# Patient Record
Sex: Male | Born: 1955 | Race: White | Hispanic: No | Marital: Married | Smoking: Never smoker
Health system: Southern US, Community
[De-identification: ages and names within clinical notes are randomized; demographics above are authoritative.]

## PROBLEM LIST (undated history)

## (undated) DIAGNOSIS — F329 Major depressive disorder, single episode, unspecified: Secondary | ICD-10-CM

## (undated) DIAGNOSIS — F909 Attention-deficit hyperactivity disorder, unspecified type: Secondary | ICD-10-CM

## (undated) DIAGNOSIS — F419 Anxiety disorder, unspecified: Secondary | ICD-10-CM

## (undated) DIAGNOSIS — L570 Actinic keratosis: Secondary | ICD-10-CM

## (undated) HISTORY — DX: Actinic keratosis: L57.0

---

## 2011-02-17 DIAGNOSIS — F902 Attention-deficit hyperactivity disorder, combined type: Secondary | ICD-10-CM | POA: Insufficient documentation

## 2016-08-13 ENCOUNTER — Emergency Department
Admission: EM | Admit: 2016-08-13 | Discharge: 2016-08-13 | Disposition: A | Discharge status: Home / Self Care | Payer: BLUE CROSS/BLUE SHIELD | Attending: Emergency Medicine | Admitting: Emergency Medicine

## 2016-08-13 ENCOUNTER — Emergency Department: Payer: BLUE CROSS/BLUE SHIELD

## 2016-08-13 ENCOUNTER — Encounter: Payer: Self-pay | Admitting: Emergency Medicine

## 2016-08-13 ENCOUNTER — Telehealth: Payer: Self-pay

## 2016-08-13 DIAGNOSIS — F909 Attention-deficit hyperactivity disorder, unspecified type: Secondary | ICD-10-CM | POA: Diagnosis not present

## 2016-08-13 DIAGNOSIS — R002 Palpitations: Secondary | ICD-10-CM | POA: Diagnosis present

## 2016-08-13 DIAGNOSIS — I480 Paroxysmal atrial fibrillation: Secondary | ICD-10-CM | POA: Diagnosis not present

## 2016-08-13 DIAGNOSIS — I4891 Unspecified atrial fibrillation: Secondary | ICD-10-CM

## 2016-08-13 HISTORY — DX: Anxiety disorder, unspecified: F41.9

## 2016-08-13 HISTORY — DX: Attention-deficit hyperactivity disorder, unspecified type: F90.9

## 2016-08-13 HISTORY — DX: Major depressive disorder, single episode, unspecified: F32.9

## 2016-08-13 LAB — CBC WITH DIFFERENTIAL/PLATELET
BASOS ABS: 0.1 10*3/uL (ref 0–0.1)
Basophils Relative: 1 %
EOS ABS: 0.2 10*3/uL (ref 0–0.7)
Eosinophils Relative: 2 %
HCT: 46 % (ref 40.0–52.0)
HEMOGLOBIN: 15.6 g/dL (ref 13.0–18.0)
LYMPHS ABS: 1.4 10*3/uL (ref 1.0–3.6)
Lymphocytes Relative: 19 %
MCH: 30.2 pg (ref 26.0–34.0)
MCHC: 34 g/dL (ref 32.0–36.0)
MCV: 88.9 fL (ref 80.0–100.0)
Monocytes Absolute: 0.9 10*3/uL (ref 0.2–1.0)
Monocytes Relative: 12 %
NEUTROS PCT: 66 %
Neutro Abs: 4.7 10*3/uL (ref 1.4–6.5)
PLATELETS: 346 10*3/uL (ref 150–440)
RBC: 5.18 MIL/uL (ref 4.40–5.90)
RDW: 14.4 % (ref 11.5–14.5)
WBC: 7.2 10*3/uL (ref 3.8–10.6)

## 2016-08-13 LAB — BASIC METABOLIC PANEL WITH GFR
Anion gap: 7 (ref 5–15)
BUN: 19 mg/dL (ref 6–20)
CO2: 26 mmol/L (ref 22–32)
Calcium: 9.3 mg/dL (ref 8.9–10.3)
Chloride: 106 mmol/L (ref 101–111)
Creatinine, Ser: 0.79 mg/dL (ref 0.61–1.24)
GFR calc Af Amer: >60 mL/min
GFR calc non Af Amer: >60 mL/min
Glucose, Bld: 127 mg/dL — ABNORMAL HIGH (ref 65–99)
Potassium: 3.7 mmol/L (ref 3.5–5.1)
Sodium: 139 mmol/L (ref 135–145)

## 2016-08-13 LAB — MAGNESIUM: MAGNESIUM: 2 mg/dL (ref 1.7–2.4)

## 2016-08-13 LAB — TROPONIN I

## 2016-08-13 LAB — TSH: TSH: 1.311 u[IU]/mL (ref 0.350–4.500)

## 2016-08-13 MED ORDER — ASPIRIN 81 MG PO CHEW
324.0000 mg | CHEWABLE_TABLET | Freq: Once | ORAL | Status: AC
  Administered 2016-08-13: 324 mg via ORAL
  Filled 2016-08-13: qty 4

## 2016-08-13 MED ORDER — METOPROLOL TARTRATE 50 MG PO TABS: 50.0000 mg | ORAL_TABLET | Freq: Two times a day (BID) | ORAL | 1 refills | Status: DC

## 2016-08-13 MED ORDER — METOPROLOL TARTRATE 50 MG PO TABS
50.0000 mg | ORAL_TABLET | Freq: Once | ORAL | Status: AC
  Administered 2016-08-13: 50 mg via ORAL
  Filled 2016-08-13: qty 1

## 2016-08-13 MED ORDER — ASPIRIN 81 MG PO TABS: 81.0000 mg | ORAL_TABLET | Freq: Every day | ORAL | 1 refills | Status: DC

## 2016-08-13 MED ORDER — DILTIAZEM HCL 60 MG PO TABS
60.0000 mg | ORAL_TABLET | Freq: Once | ORAL | Status: AC
  Administered 2016-08-13: 60 mg via ORAL
  Filled 2016-08-13: qty 1

## 2016-08-13 MED ORDER — SODIUM CHLORIDE 0.9 % IV BOLUS (SEPSIS)
1000.0000 mL | Freq: Once | INTRAVENOUS | Status: AC
  Administered 2016-08-13: 1000 mL via INTRAVENOUS

## 2016-08-13 MED ORDER — DILTIAZEM HCL 25 MG/5ML IV SOLN
30.0000 mg | Freq: Once | INTRAVENOUS | Status: AC
  Administered 2016-08-13: 30 mg via INTRAVENOUS
  Filled 2016-08-13: qty 10

## 2016-08-13 MED ORDER — DILTIAZEM HCL 25 MG/5ML IV SOLN
20.0000 mg | Freq: Once | INTRAVENOUS | Status: AC
  Administered 2016-08-13: 20 mg via INTRAVENOUS

## 2016-08-13 MED ORDER — DILTIAZEM HCL 25 MG/5ML IV SOLN
INTRAVENOUS | Status: AC
  Administered 2016-08-13: 20 mg via INTRAVENOUS
  Filled 2016-08-13: qty 5

## 2016-08-13 MED ORDER — MAGNESIUM SULFATE 2 GM/50ML IV SOLN
2.0000 g | Freq: Once | INTRAVENOUS | Status: AC
Start: 2016-08-13 — End: 2016-08-13
  Administered 2016-08-13: 2 g via INTRAVENOUS
  Filled 2016-08-13: qty 50

## 2016-08-13 NOTE — ED Notes (Signed)
Upon ambulation pt heart rate in 160's, pt denies any complaints at this time.  Pt back to bed, heart rate 107 at rest.  MD notified.

## 2016-08-13 NOTE — ED Provider Notes (Signed)
[facility] Emergency Department Provider Note  ____________________________________________  Time seen: Approximately 8:28 AM  I have reviewed the triage vital signs and the nursing notes.   HISTORY  Chief Complaint Palpitations   HPI Wesley Collins is a 61 y.o. male presents for evaluation of palpitations. Patient went to his primary care doctor today to have his ADHD medication refilled when he mentioned that he was having palpitations on and off since October. They did an EKG and found patient to be in atrial fibrillation and patient was sent to the emergency room. Patient reports that since Thanksgiving these episodes have become daily mostly in the morning when he wakes up and they resolved around lunchtime. He denies ever having chest pain, dizziness, shortness of breath with these episodes. Patient denies any personal or family history of ischemic heart disease or arrhythmias. He drinks occasionally, does not smoke, does not use drugs. Drinks 4 cups of coffee a day. No personal or family history of blood clots, no recent travel or immobilization, no leg pain or swelling, no hemoptysis or exogenous hormones. Patient denies ever seeing a cardiologist.  Past Medical History:  Diagnosis Date  . ADHD   . Anxiety   . Depression     There are no active problems to display for this patient.   History reviewed. No pertinent surgical history.  Prior to Admission medications   Medication Sig Start Date End Date Taking? Authorizing Provider  aspirin 81 MG tablet Take 1 tablet (81 mg total) by mouth daily. 08/13/16   Nita Sickle, MD  methylphenidate (METADATE CD) 40 MG CR capsule Take 40 mg by mouth every morning.    Historical Provider, MD  metoprolol (LOPRESSOR) 50 MG tablet Take 1 tablet (50 mg total) by mouth 2 (two) times daily. 08/13/16 08/13/17  Nita Sickle, MD    Allergies Strattera [atomoxetine hcl]  No family history on file.  Social  History Social History  Substance Use Topics  . Smoking status: Never Smoker  . Smokeless tobacco: Never Used  . Alcohol use Yes     Comment: occassional     Review of Systems  Constitutional: Negative for fever. Eyes: Negative for visual changes. ENT: Negative for sore throat. Neck: No neck pain  Cardiovascular: Negative for chest pain. + palpitations Respiratory: Negative for shortness of breath. Gastrointestinal: Negative for abdominal pain, vomiting or diarrhea. Genitourinary: Negative for dysuria. Musculoskeletal: Negative for back pain. Skin: Negative for rash. Neurological: Negative for headaches, weakness or numbness. Psych: No SI or HI  ____________________________________________   PHYSICAL EXAM:  VITAL SIGNS: ED Triage Vitals  Enc Vitals Group     BP --      Pulse --      Resp 08/13/16 0810 20     Temp 08/13/16 0810 98.2 F (36.8 C)     Temp Source 08/13/16 0810 Oral     SpO2 08/13/16 0810 97 %     Weight 08/13/16 0811 190 lb (86.2 kg)     Height 08/13/16 0811 5\' 11"  (1.803 m)     Head Circumference --      Peak Flow --      Pain Score --      Pain Loc --      Pain Edu? --      Excl. in GC? --     Constitutional: Alert and oriented. Well appearing and in no apparent distress. HEENT:      Head: Normocephalic and atraumatic.  Eyes: Conjunctivae are normal. Sclera is non-icteric. EOMI. PERRL      Mouth/Throat: Mucous membranes are moist.       Neck: Supple with no signs of meningismus. Cardiovascular: Irregularly irregular rhythm with a tachycardic rate. No murmurs, gallops, or rubs. 2+ symmetrical distal pulses are present in all extremities. No JVD. Respiratory: Normal respiratory effort. Lungs are clear to auscultation bilaterally. No wheezes, crackles, or rhonchi.  Gastrointestinal: Soft, non tender, and non distended with positive bowel sounds. No rebound or guarding. Genitourinary: No CVA tenderness. Musculoskeletal: Nontender with  normal range of motion in all extremities. No edema, cyanosis, or erythema of extremities. Neurologic: Normal speech and language. Face is symmetric. Moving all extremities. No gross focal neurologic deficits are appreciated. Skin: Skin is warm, dry and intact. No rash noted. Psychiatric: Mood and affect are normal. Speech and behavior are normal.  ____________________________________________   LABS (all labs ordered are listed, but only abnormal results are displayed)  Labs Reviewed  BASIC METABOLIC PANEL - Abnormal; Notable for the following:       Result Value   Glucose, Bld 127 (*)    All other components within normal limits  CBC WITH DIFFERENTIAL/PLATELET  TROPONIN I  TSH  MAGNESIUM  TROPONIN I   ____________________________________________  EKG  ED ECG REPORT I, Nita Sicklearolina Khalid Lacko, the attending physician, personally viewed and interpreted this ECG.  Atrial fibrillation, ventricular rate of 165, normal QRS and QTc intervals, normal axis, no ST elevations or depressions. ____________________________________________  RADIOLOGY  CXR: Negative  ____________________________________________   PROCEDURES  Procedure(s) performed: None Procedures Critical Care performed:  None ____________________________________________   INITIAL IMPRESSION / ASSESSMENT AND PLAN / ED COURSE  61 y.o. male presents for evaluation of palpitations after found to be in atrial fibrillation at the PCPs office. Patient is asymptomatic. Initial EKG showing rate in the 160s. Patient was given Cardizem 20 mg IV followed by 30 mg IV with good rate control. He was then given by mouth Cardizem 60 mg. We'll check CBC for evidence of anemia, electrolytes for any abnormalities, TSH for hyperthyroidism, we'll cycle cardiac enzymes. Patient has no risk factors for PE and no chest pain. Watch patient on telemetry.  Clinical Course as of Aug 14 1235  Thu Aug 13, 2016  1234 Discussed patient with Dr. Kirke CorinArida  who recommended starting patient on by mouth metoprolol as patient heart rate was within normal limits at rest however jumped up to the 160s with ambulation to the bathroom in the ED while on Cardizem only. Patient received a 50 mg of by mouth metoprolol and his heart rate is currently in the 80s. I just ambulated patient and his maximum heart rate was 100. Patient remains asymptomatic. He will be discharged home on 50 mg of metoprolol twice a day, baby aspirin, and close follow-up with Dr. Kirke CorinArida in his office. Patient comfortable with the plan.  [CV]    Clinical Course User Index [CV] Nita Sicklearolina Leeloo Silverthorne, MD    Pertinent labs & imaging results that were available during my care of the patient were reviewed by me and considered in my medical decision making (see chart for details).    ____________________________________________   FINAL CLINICAL IMPRESSION(S) / ED DIAGNOSES  Final diagnoses:  Atrial fibrillation with RVR (HCC)      NEW MEDICATIONS STARTED DURING THIS VISIT:  New Prescriptions   ASPIRIN 81 MG TABLET    Take 1 tablet (81 mg total) by mouth daily.   METOPROLOL (LOPRESSOR) 50 MG TABLET  Take 1 tablet (50 mg total) by mouth 2 (two) times daily.     Note:  This document was prepared using Dragon voice recognition software and may include unintentional dictation errors.    Nita Sickle, MD 08/13/16 1239

## 2016-08-13 NOTE — Telephone Encounter (Signed)
l mom to schedule ED f/u. (within 5 days per dr Kirke Corinarida)

## 2016-08-13 NOTE — Telephone Encounter (Signed)
-----   Message from [place] OuchMuhammad A Arida, MD sent at 08/13/2016 11:26 AM EST ----- Patient referred from ER for A-fib. Schedule with first available MD within 5 days.

## 2016-08-13 NOTE — ED Triage Notes (Signed)
Pt in via EMS from [facility]; was at PCP today to refill medication, heart rate in 140's.  Pt reports intermittent palpitations since approximately October.  Pt denies any chest pain, does repots some shortness of breath upon exertion with the episodes.  Pt A/Ox4, heart rate in 160's upon arrival. MD notified.

## 2016-08-18 ENCOUNTER — Encounter: Payer: Self-pay | Admitting: Cardiology

## 2016-08-18 ENCOUNTER — Other Ambulatory Visit
Admission: RE | Admit: 2016-08-18 | Discharge: 2016-08-18 | Disposition: A | Discharge status: Home / Self Care | Payer: BLUE CROSS/BLUE SHIELD | Source: Ambulatory Visit | Attending: Cardiology | Admitting: Cardiology

## 2016-08-18 ENCOUNTER — Ambulatory Visit (INDEPENDENT_AMBULATORY_CARE_PROVIDER_SITE_OTHER): Payer: BLUE CROSS/BLUE SHIELD | Admitting: Cardiology

## 2016-08-18 VITALS — BP 120/78 | HR 126 | Ht 71.0 in | Wt 187.8 lb

## 2016-08-18 DIAGNOSIS — Z79899 Other long term (current) drug therapy: Secondary | ICD-10-CM

## 2016-08-18 DIAGNOSIS — R000 Tachycardia, unspecified: Secondary | ICD-10-CM | POA: Diagnosis present

## 2016-08-18 DIAGNOSIS — Z01818 Encounter for other preprocedural examination: Secondary | ICD-10-CM

## 2016-08-18 DIAGNOSIS — Z01810 Encounter for preprocedural cardiovascular examination: Secondary | ICD-10-CM

## 2016-08-18 DIAGNOSIS — I4891 Unspecified atrial fibrillation: Secondary | ICD-10-CM | POA: Diagnosis not present

## 2016-08-18 LAB — HEPATIC FUNCTION PANEL
ALK PHOS: 66 U/L (ref 38–126)
ALT: 69 U/L — AB (ref 17–63)
AST: 28 U/L (ref 15–41)
Albumin: 4.4 g/dL (ref 3.5–5.0)
BILIRUBIN TOTAL: 0.6 mg/dL (ref 0.3–1.2)
Total Protein: 7.3 g/dL (ref 6.5–8.1)

## 2016-08-18 LAB — PROTIME-INR
INR: 1.12
Prothrombin Time: 14.5 seconds (ref 11.4–15.2)

## 2016-08-18 MED ORDER — APIXABAN 5 MG PO TABS: 5.0000 mg | ORAL_TABLET | Freq: Two times a day (BID) | ORAL | 3 refills | Status: AC

## 2016-08-18 NOTE — Progress Notes (Signed)
Cardiology Office Note   Date:  08/18/2016   ID:  Wesley Collins, DOB Dec 11, 1955, MRN [id number]  Referring Doctor:  Rolm Gala, MD   Cardiologist:   Almond Lint, MD   Reason for consultation:  Chief Complaint  Patient presents with  . other    follow up from [facility] ER; rapid heart beats & A-Fib. Meds reviewed by the pt. verbally.       History of Present Illness: Wesley Collins is a 61 y.o. male who presents for evaluation for Afib  Pt pesented to PCP last week for routine physical. Noted to have irregular HR, EKG showed afib. He was then sent to ER. Afib RVR. Trops negative .Advised admission but pt refused. Ff up today for evaluation.  Pt reports that he started noticing irregular heartbeat back in October 2017. He denies shortness of breath at rest. He did notice increased feeling of getting winded with exertion, SOB with exertion, moderate in intensity, lasting throughout the duration of exertion, randomly occurring, spontaneously resolving. He denies chest pain.  Reports occasional dizziness. No loss of consciousness.  Patient has been healthy all his life, and does not want to go to doctors. He therefore just waited for his regular physical. They discovered that he was in atrial fibrillation.  Otherwise, patient does not complain of fever, cough, colds, abdominal pain, GI bleed, dysphagia, odynophagia, history of radiation to the chest  ROS:  Please see the history of present illness. Aside from mentioned under HPI, all other systems are reviewed and negative.     Past Medical History:  Diagnosis Date  . ADHD   . Anxiety   . Depression     History reviewed. No pertinent surgical history.   reports that he has never smoked. He has never used smokeless tobacco. He reports that he drinks alcohol. He reports that he does not use drugs.   family history includes Alzheimer's disease in his father; Leukemia in his mother.   Outpatient Medications Prior to Visit    Medication Sig Dispense Refill  . aspirin 81 MG tablet Take 1 tablet (81 mg total) by mouth daily. 30 tablet 1  . metoprolol (LOPRESSOR) 50 MG tablet Take 1 tablet (50 mg total) by mouth 2 (two) times daily. 60 tablet 1  . methylphenidate (METADATE CD) 40 MG CR capsule Take 40 mg by mouth every morning.     No facility-administered medications prior to visit.     He has stopped taking the methylphenidate  Allergies: Strattera [atomoxetine hcl] and Atomoxetine    PHYSICAL EXAM: VS:  BP 120/78 (BP Location: Left Arm, Patient Position: Sitting, Cuff Size: Normal)   Pulse (!) 126   Ht 5\' 11"  (1.803 m)   Wt 187 lb 12 oz (85.2 kg)   BMI 26.19 kg/m  , Body mass index is 26.19 kg/m. Wt Readings from Last 3 Encounters:  08/18/16 187 lb 12 oz (85.2 kg)  08/13/16 190 lb (86.2 kg)    GENERAL:  well developed, well nourished, not in acute distress HEENT: normocephalic, pink conjunctivae, anicteric sclerae, no xanthelasma, normal dentition, oropharynx clear NECK:  no neck vein engorgement, JVP normal, no hepatojugular reflux, carotid upstroke brisk and symmetric, no bruit, no thyromegaly, no lymphadenopathy LUNGS:  good respiratory effort, clear to auscultation bilaterally CV:  PMI not displaced, no thrills, no lifts, S1 and S2 within normal limits, no palpable S3 or S4, no murmurs, no rubs, no gallops ABD:  Soft, nontender, nondistended, normoactive bowel sounds, no abdominal aortic  bruit, no hepatomegaly, no splenomegaly MS: nontender back, no kyphosis, no scoliosis, no joint deformities EXT:  2+ DP/PT pulses, no edema, no varicosities, no cyanosis, no clubbing SKIN: warm, nondiaphoretic, normal turgor, no ulcers NEUROPSYCH: alert, oriented to person, place, and time, sensory/motor grossly intact, normal mood, appropriate affect  Recent Labs: 08/13/2016: BUN 19; Creatinine, Ser 0.79; Hemoglobin 15.6; Magnesium 2.0; Platelets 346; Potassium 3.7; Sodium 139; TSH 1.311   Lipid Panel No  results found for: CHOL, TRIG, HDL, CHOLHDL, VLDL, LDLCALC, LDLDIRECT   Other studies Reviewed:  EKG:  The ekg from 08/18/2016 was personally reviewed by me and it revealed A. fib, RVR, 126 BPM, nonspecific ST-T changes.  Additional studies/ records that were reviewed personally reviewed by me today include: None available   ASSESSMENT AND PLAN:  Atrial fibrillation, persistent, rapid  Ventricular  Response Unknown onsets/duration Rate not well controlled on dose of metoprolol Lengthy and detailed discussion about the pathophysiology of atrial fibrillation and the associated stroke risks. Discussed treatment options. Patient will like to proceed with trial of restoration of sinus rhythm. In this case, we will need to do a TEE to rule out left atrial thrombus. Patient does not want to get admitted to the hospital and prefers to get this an outpatient setting. We will set him up for a TEE plus minus cardioversion with the help of anesthesia.  We have discussed the nature of the procedure TEE as well as cardoversion, the benefits of rhythm restoration, as well as the risk of each part of the procedure including esophageal perforation, bleeding, infection, stroke, MI, death, among other things. Less than 1% chance of major complications. Patient and his wife verbalized understanding of the procedure and would like to proceed. we have gone over the potential outcomes of the TEE cardioversion, including inability to proceed with cardioversion if there is presence of LA thrombus, restoration of sinus rhythm with successful cardioversion, staying in sinus and then going back into atrial fibrilation, unsuccessful cardioversion. We will transition him to eliquis 5 mg twice a day for now in anticipation of possible cardioversion. We can discontinue aspirin when he starts eliquis. Continue metoprolol for now. Patient was instructed to be nothing by mouth post midnight the night before planned procedure. He  was also instructed to hold metoprolol the evening before the date of procedure. All his questions were answered.  Abstinence from alcohol and smoking cessation recommended.  Current medicines are reviewed at length with the patient today.  The patient does not have concerns regarding medicines.  Labs/ tests ordered today include: Orders Placed This Encounter  Procedures  . EKG 12-Lead    I had a lengthy and detailed discussion with the patient regarding diagnoses, prognosis, diagnostic options, treatment options , and side effects of medications.   I counseled the patient on importance of lifestyle modification including heart healthy diet, regular physical activity .   Disposition:   FU with undersigned after TEE/CV  Thank you for this consultation. We will forwarding this consultation to referring physician.    I spent at least 80 minutes with the patient today and more than 50% of the time was spent counseling the patient and coordinating care.   Signed, Almond LintAileen Mishell Donalson, MD  08/18/2016 4:00 PM    [facility]  This note was generated in part with voice recognition software and I apologize for any typographical errors that were not detected and corrected.

## 2016-08-18 NOTE — Patient Instructions (Addendum)
Medication Instructions:  Your physician has recommended you make the following change in your medication:  1- Eliquis 5 mg (1 tablet) by mouth twice a day. 2- STOP taking aspirin.  Labwork: Your physician recommends that you return for lab work in: TODAY at the [facility].   Testing/Procedures: Your physician has requested that you have a TEE/Cardioversion. During a TEE, sound waves are used to create images of your heart. It provides your doctor with information about the size and shape of your heart and how well your heart's chambers and valves are working. In this test, a transducer is attached to the end of a flexible tube that is guided down you throat and into your esophagus (the tube leading from your mouth to your stomach) to get a more detailed image of your heart. Once the TEE has determined that a blood clot is not present, the cardioversion begins. Electrical Cardioversion uses a jolt of electricity to your heart either through paddles or wired patches attached to your chest. This is a controlled, usually prescheduled, procedure. This procedure is done at the hospital and you are not awake during the procedure. You usually go home the day of the procedure. Please see the instruction sheet given to you today for more information.  You are scheduled for a Cardioversion on __1/18/18______ with Dr. Almond Lint. Please arrive at the [facility] at _06:30  a.m. on the day of your procedure.  DIET INSTRUCTIONS:  Nothing to eat or drink after midnight except your medications with a  sip of water.         1) Labs: _Pt/INR, Liver function at [facility] TODAY.  2) Medications:  YOU MAY TAKE ALL of your remaining medications with a small amount of water.  3) Must have a responsible person to drive you home.  4) Bring a current list of your medications and current insurance cards.    If you have any questions after you get home, please call the office at [phone]  Follow-Up: Your physician recommends that you schedule a follow-up appointment in: AFTER THE TEE/CARDIOVERSON.   If you need a refill on your cardiac medications before your next appointment, please call your pharmacy.    Transesophageal Echocardiogram Transesophageal echocardiography (TEE) is a special type of test that produces images of the heart by using sound waves (echocardiogram). This type of echocardiography can obtain better images of the heart than standard echocardiography. TEE is done by passing a flexible tube down the esophagus. The heart is located in front of the esophagus. Because the heart and esophagus are close to one another, your health care provider can take very clear, detailed pictures of the heart via ultrasound waves. TEE may be done:  If your health care provider needs more information based on standard echocardiography findings.  If you had a stroke. This might have happened because a clot formed in your heart. TEE can visualize different areas of the heart and check for clots.  To check valve anatomy and function.  To check for infection on the inside of your heart (endocarditis).  To evaluate the dividing wall (septum) of the heart and presence of a hole that did not close after birth (patent foramen ovale or atrial septal defect).  To help diagnose a tear in the wall of the aorta (aortic dissection).  During cardiac valve surgery. This allows the surgeon to assess the valve repair before closing the chest.  During a variety of other cardiac  procedures to guide positioning of catheters.  Sometimes before a cardioversion, which is a shock to convert heart rhythm back to normal. LET [facility] CARE PROVIDER KNOW ABOUT:   Any allergies you have.  All medicines you are taking, including vitamins, herbs, eye drops, creams, and over-the-counter medicines.  Previous problems you or members of your family have had with the use of anesthetics.  Any  blood disorders you have.  Previous surgeries you have had.  Medical conditions you have.  Swallowing difficulties.  An esophageal obstruction. RISKS AND COMPLICATIONS  Generally, TEE is a safe procedure. However, as with any procedure, complications can occur. Possible complications include an esophageal tear (rupture). BEFORE THE PROCEDURE   Do not eat or drink for 6 hours before the procedure or as directed by your health care provider.  Arrange for someone to drive you home after the procedure. Do not drive yourself home. During the procedure, you will be given medicines that can continue to make you feel drowsy and can impair your reflexes.  An IV access tube will be started in the arm. PROCEDURE   A medicine to help you relax (sedative) will be given through the IV access tube.  A medicine may be sprayed or gargled to numb the back of the throat.  Your blood pressure, heart rate, and breathing (vital signs) will be monitored during the procedure.  The TEE probe is a long, flexible tube. The tip of the probe is placed into the back of the mouth, and you will be asked to swallow. This helps to pass the tip of the probe into the esophagus. Once the tip of the probe is in the correct area, your health care provider can take pictures of the heart.  TEE is usually not a painful procedure. You may feel the probe press against the back of the throat. The probe does not enter the trachea and does not affect your breathing. AFTER THE PROCEDURE   You will be in bed, resting, until you have fully returned to consciousness.  When you first awaken, your throat may feel slightly sore and will probably still feel numb. This will improve slowly over time.  You will not be allowed to eat or drink until it is clear that the numbness has improved.  Once you have been able to drink, urinate, and sit on the edge of the bed without feeling sick to your stomach (nausea) or dizzy, you may be  cleared to go home.  You should have a friend or family member with you for the next 24 hours after your procedure. This information is not intended to replace advice given to you by your health care provider. Make sure you discuss any questions you have with your health care provider. Document Released: 10/10/2002 Document Revised: 07/25/2013 Document Reviewed: 01/19/2013 Elsevier Interactive Patient Education  2017 [employer].  Writer cardioversion is the delivery of a jolt of electricity to restore a normal rhythm to the heart. A rhythm that is too fast or is not regular keeps the heart from pumping well. In this procedure, sticky patches or metal paddles are placed on the chest to deliver electricity to the heart from a device. This procedure may be done in an emergency if:  There is low or no blood pressure as a result of the heart rhythm.  Normal rhythm must be restored as fast as possible to protect the brain and heart from further damage.  It may save a life. This  procedure may also be done for irregular or fast heart rhythms that are not immediately life-threatening. Tell a health care provider about:  Any allergies you have.  All medicines you are taking, including vitamins, herbs, eye drops, creams, and over-the-counter medicines.  Any problems you or family members have had with anesthetic medicines.  Any blood disorders you have.  Any surgeries you have had.  Any medical conditions you have.  Whether you are pregnant or may be pregnant. What are the risks? Generally, this is a safe procedure. However, problems may occur, including:  Allergic reactions to medicines.  A blood clot that breaks free and travels to other parts of your body.  The possible return of an abnormal heart rhythm within hours or days after the procedure.  Your heart stopping (cardiac arrest). This is rare. What happens before the procedure? Medicines  Your  health care provider may have you start taking:  Blood-thinning medicines (anticoagulants) so your blood does not clot as easily.  Medicines may be given to help stabilize your heart rate and rhythm.  Ask your health care provider about changing or stopping your regular medicines. This is especially important if you are taking diabetes medicines or blood thinners. General instructions  Plan to have someone take you home from the hospital or clinic.  If you will be going home right after the procedure, plan to have someone with you for 24 hours.  Follow instructions from your health care provider about eating or drinking restrictions. What happens during the procedure?  To lower your risk of infection:  Your health care team will wash or sanitize their hands.  Your skin will be washed with soap.  An IV tube will be inserted into one of your veins.  You will be given a medicine to help you relax (sedative).  Sticky patches (electrodes) or metal paddles may be placed on your chest.  An electrical shock will be delivered. The procedure may vary among health care providers and hospitals. What happens after the procedure?  Your blood pressure, heart rate, breathing rate, and blood oxygen level will be monitored until the medicines you were given have worn off.  Do not drive for 24 hours if you were given a sedative.  Your heart rhythm will be watched to make sure it does not change. This information is not intended to replace advice given to you by your health care provider. Make sure you discuss any questions you have with your health care provider. Document Released: 07/10/2002 Document Revised: 03/18/2016 Document Reviewed: 01/24/2016 Elsevier Interactive Patient Education  2017 [employer]  Electrical Cardioversion, Care After This sheet gives you information about how to care for yourself after your procedure. Your health care provider may also give you more specific  instructions. If you have problems or questions, contact your health care provider. What can I expect after the procedure? After the procedure, it is common to have:  Some redness on the skin where the shocks were given. Follow these instructions at home:  Do not drive for 24 hours if you were given a medicine to help you relax (sedative).  Take over-the-counter and prescription medicines only as told by your health care provider.  Ask your health care provider how to check your pulse. Check it often.  Rest for 48 hours after the procedure or as told by your health care provider.  Avoid or limit your caffeine use as told by your health care provider. Contact a health care provider if:  You feel like your heart is beating too quickly or your pulse is not regular.  You have a serious muscle cramp that does not go away. Get help right away if:  You have discomfort in your chest.  You are dizzy or you feel faint.  You have trouble breathing or you are short of breath.  Your speech is slurred.  You have trouble moving an arm or leg on one side of your body.  Your fingers or toes turn cold or blue. This information is not intended to replace advice given to you by your health care provider. Make sure you discuss any questions you have with your health care provider. Document Released: 05/10/2013 Document Revised: 02/21/2016 Document Reviewed: 01/24/2016 Elsevier Interactive Patient Education  2017 [employer].

## 2016-08-19 ENCOUNTER — Encounter: Payer: Self-pay | Admitting: Cardiology

## 2016-08-21 ENCOUNTER — Ambulatory Visit (HOSPITAL_BASED_OUTPATIENT_CLINIC_OR_DEPARTMENT_OTHER)
Admission: RE | Admit: 2016-08-21 | Discharge: 2016-08-21 | Disposition: A | Discharge status: Home / Self Care | Payer: BLUE CROSS/BLUE SHIELD | Source: Ambulatory Visit | Attending: Cardiology | Admitting: Cardiology

## 2016-08-21 ENCOUNTER — Ambulatory Visit
Admission: RE | Admit: 2016-08-21 | Discharge: 2016-08-21 | Disposition: A | Discharge status: Home / Self Care | Payer: BLUE CROSS/BLUE SHIELD | Source: Ambulatory Visit | Attending: Cardiology | Admitting: Cardiology

## 2016-08-21 ENCOUNTER — Ambulatory Visit: Payer: BLUE CROSS/BLUE SHIELD | Admitting: Certified Registered Nurse Anesthetist

## 2016-08-21 ENCOUNTER — Encounter
Admission: RE | Disposition: A | Discharge status: Home / Self Care | Payer: Self-pay | Source: Ambulatory Visit | Attending: Cardiology

## 2016-08-21 DIAGNOSIS — I481 Persistent atrial fibrillation: Secondary | ICD-10-CM | POA: Insufficient documentation

## 2016-08-21 DIAGNOSIS — Z7982 Long term (current) use of aspirin: Secondary | ICD-10-CM | POA: Diagnosis not present

## 2016-08-21 DIAGNOSIS — F329 Major depressive disorder, single episode, unspecified: Secondary | ICD-10-CM | POA: Insufficient documentation

## 2016-08-21 DIAGNOSIS — Z79899 Other long term (current) drug therapy: Secondary | ICD-10-CM | POA: Insufficient documentation

## 2016-08-21 DIAGNOSIS — F419 Anxiety disorder, unspecified: Secondary | ICD-10-CM | POA: Insufficient documentation

## 2016-08-21 DIAGNOSIS — I340 Nonrheumatic mitral (valve) insufficiency: Secondary | ICD-10-CM | POA: Diagnosis not present

## 2016-08-21 DIAGNOSIS — Z888 Allergy status to other drugs, medicaments and biological substances status: Secondary | ICD-10-CM | POA: Diagnosis not present

## 2016-08-21 DIAGNOSIS — Z7901 Long term (current) use of anticoagulants: Secondary | ICD-10-CM | POA: Insufficient documentation

## 2016-08-21 DIAGNOSIS — F909 Attention-deficit hyperactivity disorder, unspecified type: Secondary | ICD-10-CM | POA: Insufficient documentation

## 2016-08-21 HISTORY — PX: ELECTROPHYSIOLOGIC STUDY: SHX172A

## 2016-08-21 HISTORY — PX: TEE WITHOUT CARDIOVERSION: SHX5443

## 2016-08-21 SURGERY — ECHOCARDIOGRAM, TRANSESOPHAGEAL
Anesthesia: General

## 2016-08-21 MED ORDER — MIDAZOLAM HCL 2 MG/2ML IJ SOLN
INTRAMUSCULAR | Status: AC
Start: 2016-08-21 — End: 2016-08-21
  Filled 2016-08-21: qty 2

## 2016-08-21 MED ORDER — BUTAMBEN-TETRACAINE-BENZOCAINE 2-2-14 % EX AERO
INHALATION_SPRAY | CUTANEOUS | Status: AC
  Filled 2016-08-21: qty 20

## 2016-08-21 MED ORDER — PROPOFOL 10 MG/ML IV BOLUS
INTRAVENOUS | Status: AC
  Filled 2016-08-21: qty 20

## 2016-08-21 MED ORDER — SODIUM CHLORIDE 0.9 % IV SOLN
INTRAVENOUS | Status: DC | PRN
  Administered 2016-08-21: 07:00:00 via INTRAVENOUS

## 2016-08-21 MED ORDER — LIDOCAINE VISCOUS 2 % MT SOLN
OROMUCOSAL | Status: AC
  Filled 2016-08-21: qty 15

## 2016-08-21 MED ORDER — SODIUM CHLORIDE FLUSH 0.9 % IV SOLN
INTRAVENOUS | Status: AC
  Filled 2016-08-21: qty 20

## 2016-08-21 MED ORDER — MIDAZOLAM HCL 2 MG/2ML IJ SOLN
INTRAMUSCULAR | Status: DC | PRN
  Administered 2016-08-21 (×2): 1 mg via INTRAVENOUS

## 2016-08-21 MED ORDER — PROPOFOL 10 MG/ML IV BOLUS
INTRAVENOUS | Status: DC | PRN
  Administered 2016-08-21: 10 mg via INTRAVENOUS
  Administered 2016-08-21 (×2): 20 mg via INTRAVENOUS
  Administered 2016-08-21: 10 mg via INTRAVENOUS
  Administered 2016-08-21: 40 mg via INTRAVENOUS
  Administered 2016-08-21 (×2): 20 mg via INTRAVENOUS

## 2016-08-21 NOTE — Procedures (Signed)
TEE/Cardioversion  Pt is here for TEE/CV for Afib, RVR.  Nature of procedure, benefits, and risks including < 1% major complications including but not limited to esophageal perforation, bleeding, infection, MI, CVA, death. Patient verbalized understanding and would like to proceed. Consents signed.  During this procedure the patient is administered a total of 2mg  IV Versed and 170mg  IV Propofol administered by CRNA, to achieve and maintain moderate conscious sedation.  The patient's heart rate, blood pressure, and oxygen saturation are monitored continuously during the procedure. TEE official report to follow: no LA/LAA thrombus, negative bubble study. 200J of biphasic energy was delivered for synchronized cardioversion. Patient's rhythm converted from Afib RVR to SR with occasional PVCs, HR 67bpm. Patient tolerated the procedure well, remained hemodynamically stable. No complications noted.  Patient will continue Eliquis 5mg  bid, decrease Metoprolol tartrate to 25mg  daily. Patient will follow up in our office in 1-2 weeks, and will be setup for outpatient transthoracic echo.

## 2016-08-21 NOTE — Anesthesia Preprocedure Evaluation (Signed)
Anesthesia Evaluation  Patient identified by MRN, date of birth, ID band Patient awake    Reviewed: Allergy & Precautions, H&P , NPO status , Patient's Chart, lab work & pertinent test results  History of Anesthesia Complications (+) Family history of anesthesia reaction and history of anesthetic complications (Father stopped breathing)  Airway Mallampati: I  TM Distance: >3 FB Neck ROM: full    Dental  (+) Poor Dentition, Chipped   Pulmonary neg pulmonary ROS, neg shortness of breath,    Pulmonary exam normal breath sounds clear to auscultation       Cardiovascular Exercise Tolerance: Good (-) angina(-) Past MI and (-) DOE + dysrhythmias Atrial Fibrillation  Rhythm:irregular Rate:Tachycardia     Neuro/Psych PSYCHIATRIC DISORDERS Anxiety Depression negative neurological ROS     GI/Hepatic negative GI ROS, Neg liver ROS, neg GERD  ,  Endo/Other  negative endocrine ROS  Renal/GU negative Renal ROS  negative genitourinary   Musculoskeletal   Abdominal   Peds  Hematology negative hematology ROS (+)   Anesthesia Other Findings Signs and symptoms suggestive of sleep apnea   Past Medical History: No date: ADHD No date: Anxiety No date: Depression  History reviewed. No pertinent surgical history.  BMI    Body Mass Index:  26.08 kg/m      Reproductive/Obstetrics negative OB ROS                           Anesthesia Physical Anesthesia Plan  ASA: III  Anesthesia Plan: General   Post-op Pain Management:    Induction:   Airway Management Planned:   Additional Equipment:   Intra-op Plan:   Post-operative Plan:   Informed Consent: I have reviewed the patients History and Physical, chart, labs and discussed the procedure including the risks, benefits and alternatives for the proposed anesthesia with the patient or authorized representative who has indicated his/her understanding and  acceptance.   Dental Advisory Given  Plan Discussed with: Anesthesiologist, CRNA and Surgeon  Anesthesia Plan Comments: (Plan for sedation for TEE with conversion to general for cardioversion.  This was explained to patient who voiced understanding.  )       Anesthesia Quick Evaluation

## 2016-08-21 NOTE — Transfer of Care (Signed)
Immediate Anesthesia Transfer of Care Note  Patient: Wesley SaltKevin Pendergrass  Procedure(s) Performed: Procedure(s): TRANSESOPHAGEAL ECHOCARDIOGRAM (TEE) (N/A) CARDIOVERSION (N/A)  Patient Location: PACU  Anesthesia Type:General  Level of Consciousness: awake and alert   Airway & Oxygen Therapy: Patient Spontanous Breathing and Patient connected to nasal cannula oxygen  Post-op Assessment: Report given to RN and Post -op Vital signs reviewed and stable  Post vital signs: Reviewed and stable  Last Vitals:  Vitals:   08/21/16 0753 08/21/16 0840  BP:  109/82  Pulse:  69  Resp: 18 (!) 30  Temp:      Last Pain:  Vitals:   08/21/16 0646  TempSrc: Oral         Complications: No apparent anesthesia complications

## 2016-08-21 NOTE — Progress Notes (Signed)
*  PRELIMINARY RESULTS* Echocardiogram Echocardiogram Transesophageal has been performed.  Cristela BlueHege, Khrystyna Schwalm 08/21/2016, 8:43 AM

## 2016-08-21 NOTE — Anesthesia Procedure Notes (Signed)
Date/Time: 08/21/2016 8:00 AM Performed by: Ginger CarneMICHELET, Wesley Collins Pre-anesthesia Checklist: Patient identified, Emergency Drugs available, Suction available, Patient being monitored and Timeout performed Patient Re-evaluated:Patient Re-evaluated prior to inductionOxygen Delivery Method: Nasal cannula

## 2016-08-21 NOTE — Anesthesia Postprocedure Evaluation (Deleted)
Anesthesia Post Note  Patient: Joline SaltKevin Mirabile  Procedure(s) Performed: Procedure(s) (LRB): TRANSESOPHAGEAL ECHOCARDIOGRAM (TEE) (N/A) CARDIOVERSION (N/A)  Patient location during evaluation: PACU Anesthesia Type: General Level of consciousness: awake, awake and alert and oriented Pain management: pain level controlled Vital Signs Assessment: post-procedure vital signs reviewed and stable Respiratory status: spontaneous breathing, nonlabored ventilation and respiratory function stable Cardiovascular status: blood pressure returned to baseline and stable Postop Assessment: no headache and no backache Anesthetic complications: no     Last Vitals:  Vitals:   08/21/16 0753 08/21/16 0840  BP:  109/82  Pulse:  69  Resp: 18 (!) 30  Temp:      Last Pain:  Vitals:   08/21/16 0646  TempSrc: Oral                 Ginger CarneStephanie Michelet

## 2016-08-21 NOTE — Anesthesia Postprocedure Evaluation (Signed)
Anesthesia Post Note  Patient: Wesley SaltKevin Collins  Procedure(s) Performed: Procedure(s) (LRB): TRANSESOPHAGEAL ECHOCARDIOGRAM (TEE) (N/A) CARDIOVERSION (N/A)  Patient location during evaluation: Cath Lab Anesthesia Type: General Level of consciousness: awake and alert Pain management: pain level controlled Vital Signs Assessment: post-procedure vital signs reviewed and stable Respiratory status: spontaneous breathing, nonlabored ventilation, respiratory function stable and patient connected to nasal cannula oxygen Cardiovascular status: blood pressure returned to baseline and stable Postop Assessment: no signs of nausea or vomiting Anesthetic complications: no     Last Vitals:  Vitals:   08/21/16 0909 08/21/16 0915  BP: 119/90 126/86  Pulse: 69 71  Resp: 16 15  Temp:      Last Pain:  Vitals:   08/21/16 0646  TempSrc: Oral                 Cleda MccreedyJoseph K Davy Faught

## 2016-08-21 NOTE — Addendum Note (Signed)
Addendum  created 08/21/16 1047 by Rosaria FerriesJoseph K Corianna Avallone, MD   Delete clinical note, Sign clinical note

## 2016-08-21 NOTE — Anesthesia Post-op Follow-up Note (Cosign Needed)
Anesthesia QCDR form completed.        

## 2016-08-21 NOTE — H&P (Signed)
Cardiology Office Note   Date:  08/21/2016   ID:  Wesley Collins, DOB 30-Nov-1955, MRN [id number]  Referring Doctor:  Rolm Gala, MD      Cardiologist:   Almond Lint, MD   Reason for consultation:      Chief Complaint  Patient presents with  . other    follow up from [facility] ER; rapid heart beats & A-Fib. Meds reviewed by the pt. verbally.    pt is here for TEE/CV   History of Present Illness: Wesley Collins is a 61 y.o. male who presents for TEE/CV  Per office note 08/18/2016: Pt pesented to PCP last week for routine physical. Noted to have irregular HR, EKG showed afib. He was then sent to ER. Afib RVR. Trops negative .Advised admission but pt refused. Pt reports that he started noticing irregular heartbeat back in October 2017. He denies shortness of breath at rest. He did notice increased feeling of getting winded with exertion, SOB with exertion, moderate in intensity, lasting throughout the duration of exertion, randomly occurring, spontaneously resolving. He denies chest pain.  Reports occasional dizziness. No loss of consciousness. Patient has been healthy all his life, and does not want to go to doctors. He therefore just waited for his regular physical. They discovered that he was in atrial fibrillation.  Currently, pt doing ok. No CP, SOB, LOC, dizziness, bleeding.  No hx of GIB, odynophagia, dysphaga, hx of radiation to chest.   ROS:  Please see the history of present illness. Aside from mentioned under HPI, all other systems are reviewed and negative.         Past Medical History:  Diagnosis Date  . ADHD   . Anxiety   . Depression     History reviewed. No pertinent surgical history.   reports that he has never smoked. He has never used smokeless tobacco. He reports that he drinks alcohol. He reports that he does not use drugs.   family history includes Alzheimer's disease in his father; Leukemia in his mother.         Outpatient  Medications Prior to Visit  Medication Sig Dispense Refill  . aspirin 81 MG tablet Take 1 tablet (81 mg total) by mouth daily. 30 tablet 1  . metoprolol (LOPRESSOR) 50 MG tablet Take 1 tablet (50 mg total) by mouth 2 (two) times daily. 60 tablet 1  . methylphenidate (METADATE CD) 40 MG CR capsule Take 40 mg by mouth every morning.     No facility-administered medications prior to visit.     He has stopped taking the methylphenidate  Allergies: Strattera [atomoxetine hcl] and Atomoxetine    PHYSICAL EXAM: VS:  BP 120/78 (BP Location: Left Arm, Patient Position: Sitting, Cuff Size: Normal)   Pulse (!) 126   Ht 5\' 11"  (1.803 m)   Wt 187 lb 12 oz (85.2 kg)   BMI 26.19 kg/m  , Body mass index is 26.19 kg/m.    Wt Readings from Last 3 Encounters:  08/18/16 187 lb 12 oz (85.2 kg)  08/13/16 190 lb (86.2 kg)    GENERAL:  well developed, well nourished, not in acute distress HEENT: normocephalic, pink conjunctivae, anicteric sclerae, no xanthelasma, normal dentition, oropharynx clear NECK:  no neck vein engorgement, JVP normal, no hepatojugular reflux, carotid upstroke brisk and symmetric, no bruit, no thyromegaly, no lymphadenopathy LUNGS:  good respiratory effort, clear to auscultation bilaterally CV:  PMI not displaced, no thrills, no lifts, S1 and S2 within normal limits, no palpable S3 or  S4, no murmurs, no rubs, no gallops ABD:  Soft, nontender, nondistended, normoactive bowel sounds, no abdominal aortic bruit, no hepatomegaly, no splenomegaly MS: nontender back, no kyphosis, no scoliosis, no joint deformities EXT:  2+ DP/PT pulses, no edema, no varicosities, no cyanosis, no clubbing SKIN: warm, nondiaphoretic, normal turgor, no ulcers NEUROPSYCH: alert, oriented to person, place, and time, sensory/motor grossly intact, normal mood, appropriate affect  Recent Labs: 08/13/2016: BUN 19; Creatinine, Ser 0.79; Hemoglobin 15.6; Magnesium 2.0; Platelets 346; Potassium 3.7; Sodium  139; TSH 1.311   Lipid Panel Labs(Brief)  No results found for: CHOL, TRIG, HDL, CHOLHDL, VLDL, LDLCALC, LDLDIRECT     Other studies Reviewed:  EKG:  The ekg from 08/18/2016 was personally reviewed by me and it revealed A. fib, RVR, 126 BPM, nonspecific ST-T changes.  Additional studies/ records that were reviewed personally reviewed by me today include: None available   ASSESSMENT AND PLAN:  Atrial fibrillation, persistent, rapid  Ventricular  Response Unknown onsets/duration Rate not well controlled on dose of metoprolol Lengthy and detailed discussion about the pathophysiology of atrial fibrillation and the associated stroke risks. Discussed treatment options. Patient will like to proceed with trial of restoration of sinus rhythm. In this case, we will need to do a TEE to rule out left atrial thrombus. Patient does not want to get admitted to the hospital and prefers to get this an outpatient setting. He is here for TEE plus minus cardioversion with the help of anesthesia.  We have discussed the nature of the procedure TEE as well as cardoversion, the benefits of rhythm restoration, as well as the risk of each part of the procedure including esophageal perforation, bleeding, infection, stroke, MI, death, among other things. Less than 1% chance of major complications. Patient and his wife verbalized understanding of the procedure and would like to proceed. we have gone over the potential outcomes of the TEE cardioversion, including inability to proceed with cardioversion if there is presence of LA thrombus, restoration of sinus rhythm with successful cardioversion, staying in sinus and then going back into atrial fibrilation, unsuccessful cardioversion. He has been taking eliquis 5 mg twice a day for now in anticipation of possible cardioversion. Discontinued aspirin. metoprolol last dose 08/20/2016 am.   Pt has been NPO since midnight.  Abstinence from alcohol and smoking  cessation recommended.               I had a lengthy and detailed discussion with the patient regarding diagnoses, prognosis, diagnostic options, treatment options , and side effects of medications.   I counseled the patient on importance of lifestyle modification including heart healthy diet, regular physical activity .   Disposition:   FU with undersigned after TEE/CV  Signed, Almond LintAileen Versie Fleener, MD  08/21/2016 [facility]      Electronically signed by Almond LintAileen Sha Burling, MD at 08/19/2016 2:18 PM      Office Visit on 08/18/2016        Detailed Report

## 2016-08-28 ENCOUNTER — Other Ambulatory Visit: Payer: Self-pay | Admitting: Family Medicine

## 2016-08-28 ENCOUNTER — Ambulatory Visit (INDEPENDENT_AMBULATORY_CARE_PROVIDER_SITE_OTHER): Payer: BLUE CROSS/BLUE SHIELD

## 2016-08-28 ENCOUNTER — Other Ambulatory Visit: Payer: Self-pay

## 2016-08-28 DIAGNOSIS — I4891 Unspecified atrial fibrillation: Secondary | ICD-10-CM

## 2016-08-28 LAB — ECHOCARDIOGRAM COMPLETE
Ao-asc: 33 cm
CHL CUP DOP CALC LVOT VTI: 19.5 cm
E decel time: 363 msec
EERAT: 5.11
FS: 38 % (ref 28–44)
IVS/LV PW RATIO, ED: 0.89
LA ID, A-P, ES: 46 mm
LA diam end sys: 46 mm
LA diam index: 2.24 cm/m2
LA vol: 97.5 mL
LAVOLA4C: 92.6 mL
LAVOLIN: 47.6 mL/m2
LV PW d: 12.4 mm — AB (ref 0.6–1.1)
LV TDI E'LATERAL: 11.6
LVEEAVG: 5.11
LVEEMED: 5.11
LVELAT: 11.6 cm/s
LVOT SV: 61 mL
LVOT area: 3.14 cm2
LVOT diameter: 20 mm
LVOTPV: 94 cm/s
Lateral S' vel: 10.3 cm/s
MV Dec: 363
MV pk E vel: 59.3 m/s
MVPKAVEL: 48.1 m/s
PV Reg vel dias: 96.4 cm/s
RV TAPSE: 25.9 mm
Reg peak vel: 287 cm/s
TDI e' medial: 7.51
TR max vel: 287 cm/s

## 2016-09-08 ENCOUNTER — Ambulatory Visit (INDEPENDENT_AMBULATORY_CARE_PROVIDER_SITE_OTHER): Payer: BLUE CROSS/BLUE SHIELD | Admitting: Cardiology

## 2016-09-08 ENCOUNTER — Telehealth: Payer: Self-pay | Admitting: *Deleted

## 2016-09-08 ENCOUNTER — Encounter: Payer: Self-pay | Admitting: Cardiology

## 2016-09-08 VITALS — BP 130/82 | HR 60 | Ht 71.0 in | Wt 188.0 lb

## 2016-09-08 DIAGNOSIS — I4891 Unspecified atrial fibrillation: Secondary | ICD-10-CM | POA: Diagnosis not present

## 2016-09-08 NOTE — Patient Instructions (Signed)
Medication Instructions:  Medication Samples have been provided to the patient.  Drug name: Eliquis       Strength: 5 mg        Qty: 2 boxes  LOT: [id number]  Exp.Date: Mar 2020  Dosing instructions: One tablet twice a day. Last dose on October 02, 2016  Your physician has requested that you regularly monitor and record your blood pressure readings at home. Please use the same machine at the same time of day to check your readings and record them to bring to your follow-up visit.  Make sure to monitor blood pressures and call if they stay higher than 140/80.  Follow-Up: Your physician recommends that you discuss with your primary care physician about possible sleep study.  Your physician recommends that you schedule a follow-up appointment in: 3 months with Dr. Alvino ChapelIngal.  It was a pleasure seeing you today here in the office. Please do not hesitate to give us a call back if you have any further questions. [phone]  [place] CellarPamela A. RN, BSN

## 2016-09-08 NOTE — Telephone Encounter (Signed)
Spoke with patient and reviewed Dr. Gearlean AlfIngal's recommendations to start on Aspirin 325 mg once daily when he finishes Eliquis. He verbalized understanding and had no further questions at this time.

## 2016-09-08 NOTE — Telephone Encounter (Signed)
-----   Message from Almond LintAileen Ingal, MD sent at 09/08/2016  2:54 PM EST ----- Please remind patient after his Eliquis course, he shd be on ASA 325mg  po qd. THanks

## 2016-09-08 NOTE — Telephone Encounter (Signed)
Left voicemail message to call back  

## 2016-09-08 NOTE — Progress Notes (Signed)
`    Cardiology Office Note   Date:  09/08/2016   ID:  Wesley Collins, DOB [birth year], MRN [id number]  Referring Doctor:  Rolm Gala, MD   Cardiologist:   Almond Lint, MD   Reason for consultation:  Chief Complaint  Patient presents with  . other    F/u cardioversion. Patient states he is going well. Meds reviewed verbally with patient.       History of Present Illness: Wesley Collins is a 61 y.o. male who presents for Follow-up after cardioversion  Per last visit 08/18/2016: Pt pesented to PCP last week for routine physical. Noted to have irregular HR, EKG showed afib. He was then sent to ER. Afib RVR. Trops negative .Advised admission but pt refused. Ff up today for evaluation. Pt reports that he started noticing irregular heartbeat back in October 2017. He denies shortness of breath at rest. He did notice increased feeling of getting winded with exertion, SOB with exertion, moderate in intensity, lasting throughout the duration of exertion, randomly occurring, spontaneously resolving. He denies chest pain.  Reports occasional dizziness. No loss of consciousness.  Since last visit, patient actually feels well. No palpitations. No shortness of breath. No caffeine intake for a while now. He struggling with wanting to have occasional beer here or there. Wife gets mad at him if he sneaks in a beer.  ROS:  Please see the history of present illness. Aside from mentioned under HPI, all other systems are reviewed and negative.    Past Medical History:  Diagnosis Date  . ADHD   . Anxiety   . Depression     Past Surgical History:  Procedure Laterality Date  . ELECTROPHYSIOLOGIC STUDY N/A 08/21/2016   Procedure: CARDIOVERSION;  Surgeon: Almond Lint, MD;  Location: [facility] ORS;  Service: Cardiovascular;  Laterality: N/A;  . TEE WITHOUT CARDIOVERSION N/A 08/21/2016   Procedure: TRANSESOPHAGEAL ECHOCARDIOGRAM (TEE);  Surgeon: Almond Lint, MD;  Location: [facility] ORS;  Service:  Cardiovascular;  Laterality: N/A;     reports that he has never smoked. He has never used smokeless tobacco. He reports that he drinks alcohol. He reports that he does not use drugs.   family history includes Alzheimer's disease in his father; Leukemia in his mother.   Outpatient Medications Prior to Visit  Medication Sig Dispense Refill  . apixaban (ELIQUIS) 5 MG TABS tablet Take 1 tablet (5 mg total) by mouth 2 (two) times daily. 180 tablet 3  . metoprolol (LOPRESSOR) 50 MG tablet Take 1 tablet (50 mg total) by mouth 2 (two) times daily. (Patient taking differently: Take 50 mg by mouth. ) 60 tablet 1  . aspirin 81 MG tablet Take 1 tablet (81 mg total) by mouth daily. (Patient not taking: Reported on 08/19/2016) 30 tablet 1   No facility-administered medications prior to visit.     He has stopped taking the methylphenidate  Allergies: Strattera [atomoxetine hcl]    PHYSICAL EXAM: VS:  BP 130/82 (BP Location: Left Arm, Patient Position: Sitting, Cuff Size: Normal)   Pulse 60   Ht 5\' 11"  (1.803 m)   Wt 188 lb (85.3 kg)   BMI 26.22 kg/m  , Body mass index is 26.22 kg/m. Wt Readings from Last 3 Encounters:  09/08/16 188 lb (85.3 kg)  08/21/16 187 lb (84.8 kg)  08/18/16 187 lb 12 oz (85.2 kg)    GENERAL:  well developed, well nourished, obese, not in acute distress HEENT: normocephalic, pink conjunctivae, anicteric sclerae, no xanthelasma, normal dentition, oropharynx clear NECK:  no neck vein engorgement, JVP normal, no hepatojugular reflux, carotid upstroke brisk and symmetric, no bruit, no thyromegaly, no lymphadenopathy LUNGS:  good respiratory effort, clear to auscultation bilaterally CV:  PMI not displaced, no thrills, no lifts, S1 and S2 within normal limits, no palpable S3 or S4, no murmurs, no rubs, no gallops ABD:  Soft, nontender, nondistended, normoactive bowel sounds, no abdominal aortic bruit, no hepatomegaly, no splenomegaly MS: nontender back, no kyphosis, no  scoliosis, no joint deformities EXT:  2+ DP/PT pulses, no edema, no varicosities, no cyanosis, no clubbing SKIN: warm, nondiaphoretic, normal turgor, no ulcers NEUROPSYCH: alert, oriented to person, place, and time, sensory/motor grossly intact, normal mood, appropriate affect    Recent Labs: 08/13/2016: BUN 19; Creatinine, Ser 0.79; Hemoglobin 15.6; Magnesium 2.0; Platelets 346; Potassium 3.7; Sodium 139; TSH 1.311 08/18/2016: ALT 69   Lipid Panel No results found for: CHOL, TRIG, HDL, CHOLHDL, VLDL, LDLCALC, LDLDIRECT   Other studies Reviewed:  EKG:  The ekg from 08/18/2016 was personally reviewed by me and it revealed A. fib, RVR, 126 BPM, nonspecific ST-T changes.  EKG from 09/08/2016 was personally reviewed by me and it revealed sinus rhythm, 56 BPM.  Additional studies/ records that were reviewed personally reviewed by me today include: None available Echo 08/28/2016: Left ventricle: The cavity size was normal. There was mild   concentric hypertrophy. Systolic function was normal. The   estimated ejection fraction was in the range of 60% to 65%. Wall   motion was normal; there were no regional wall motion   abnormalities. Left ventricular diastolic function parameters   were normal. - Mitral valve: There was mild regurgitation. - Left atrium: The atrium was mildly to moderately dilated. - Right atrium: The atrium was moderately dilated. - Tricuspid valve: There was mild regurgitation. - Pulmonary arteries: Systolic pressure was mildly increased. PA   peak pressure: 41 mm Hg (S).  TEE 08/21/2016: Left ventricle: Systolic function was likely normal. No evidence   of thrombus. - Mitral valve: There was moderate regurgitation. - Left atrium: No evidence of thrombus in the atrial cavity or   appendage. - Right atrium: No evidence of thrombus in the atrial cavity or   appendage. - Atrial septum: No defect or patent foramen ovale was identified. - Tricuspid valve: There was  moderate regurgitation directed   eccentrically. This was in the setting of elevated BP.  Impressions:  - Successful cardioversion. No cardiac source of emboli was identified   ASSESSMENT AND PLAN:  Atrial fibrillation, S/p CV 200 J 08/12/2016 Evidence of biatrial enlargement, normal LVEF. Continue liquids 5 mg twice a day until 10/02/2016. After which, he should be on aspirin 325 mg by mouth daily. CHADS2-VASc=0 Trial with holding metoprolol since blood pressure and heart rate within normal limits. Continue to monitor blood pressure and heart rate. Recommend a sleep study. Pt to discuss with PCP.   Abstinence from alcohol and smoking cessation recommended.  Current medicines are reviewed at length with the patient today.  The patient does not have concerns regarding medicines.  Labs/ tests ordered today include:  Orders Placed This Encounter  Procedures  . EKG 12-Lead    I had a lengthy and detailed discussion with the patient regarding diagnoses, prognosis, diagnostic options, treatment options , and side effects of medications.   I counseled the patient on importance of lifestyle modification including heart healthy diet, regular physical activity .   Disposition:   FU with undersigned in 3 months   Signed, Almond LintAileen Nithila Sumners, MD  09/08/2016 2:49 PM    [facility]  This note was generated in part with voice recognition software and I apologize for any typographical errors that were not detected and corrected.

## 2016-12-08 ENCOUNTER — Ambulatory Visit (INDEPENDENT_AMBULATORY_CARE_PROVIDER_SITE_OTHER): Payer: BLUE CROSS/BLUE SHIELD | Admitting: Cardiology

## 2016-12-08 ENCOUNTER — Encounter: Payer: Self-pay | Admitting: Cardiology

## 2016-12-08 VITALS — BP 152/88 | HR 68 | Ht 71.0 in | Wt 193.8 lb

## 2016-12-08 DIAGNOSIS — I4891 Unspecified atrial fibrillation: Secondary | ICD-10-CM

## 2016-12-08 MED ORDER — ASPIRIN EC 325 MG PO TBEC: 325.0000 mg | DELAYED_RELEASE_TABLET | Freq: Every day | ORAL | 0 refills | Status: DC

## 2016-12-08 NOTE — Progress Notes (Signed)
`    Cardiology Office Note   Date:  12/08/2016   ID:  Wesley Collins, DOB 01-06-1956, MRN [id number]  Referring Doctor:  Rolm GalaGrandis, Heidi, MD   Cardiologist:   Almond LintAileen Idella Lamontagne, MD   Reason for consultation:  Chief Complaint  Patient presents with  . other    3 month follow up. Patient denies chest pain and SOB. Meds reviewed verbally with patient.       History of Present Illness: Wesley SaltKevin Wenrick is a 61 y.o. male who presents for Follow-up for paroxysmal atrial fibrillation   Per last visit 08/18/2016: Pt pesented to PCP last week for routine physical. Noted to have irregular HR, EKG showed afib. He was then sent to ER. Afib RVR. Trops negative .Advised admission but pt refused. Ff up today for evaluation. Pt reports that he started noticing irregular heartbeat back in October 2017. He denies shortness of breath at rest. He did notice increased feeling of getting winded with exertion, SOB with exertion, moderate in intensity, lasting throughout the duration of exertion, randomly occurring, spontaneously resolving. He denies chest pain.  Reports occasional dizziness. No loss of consciousness.  Since last visit, patient has been doing quite well. Very rare episodes of palpitations, nothing that is long lasting. He admits to not taking the aspirin on a regular basis, sometimes he forgets.  He also had gone for sleep study and failed to follow-up for the CPAP titration study.  In terms of blood pressure, blood pressure usually is high in the medical office due to his anxiety. At home, and has been well-controlled.  ROS:  Please see the history of present illness. Aside from mentioned under HPI, all other systems are reviewed and negative.    Past Medical History:  Diagnosis Date  . ADHD   . Anxiety   . Depression     Past Surgical History:  Procedure Laterality Date  . ELECTROPHYSIOLOGIC STUDY N/A 08/21/2016   Procedure: CARDIOVERSION;  Surgeon: Almond LintAileen Zandra Lajeunesse, MD;  Location: [facility] ORS;   Service: Cardiovascular;  Laterality: N/A;  . TEE WITHOUT CARDIOVERSION N/A 08/21/2016   Procedure: TRANSESOPHAGEAL ECHOCARDIOGRAM (TEE);  Surgeon: Almond LintAileen Esaiah Wanless, MD;  Location: [facility] ORS;  Service: Cardiovascular;  Laterality: N/A;     reports that he has never smoked. He has never used smokeless tobacco. He reports that he drinks alcohol. He reports that he does not use drugs.   family history includes Alzheimer's disease in his father; Leukemia in his mother.   Outpatient Medications Prior to Visit  Medication Sig Dispense Refill  . apixaban (ELIQUIS) 5 MG TABS tablet Take 1 tablet (5 mg total) by mouth 2 (two) times daily. (Patient not taking: Reported on 12/08/2016) 180 tablet 3  . metoprolol (LOPRESSOR) 50 MG tablet Take 1 tablet (50 mg total) by mouth 2 (two) times daily. (Patient not taking: Reported on 12/08/2016) 60 tablet 1   No facility-administered medications prior to visit.     He has stopped taking the methylphenidate  Allergies: Strattera [atomoxetine hcl]    PHYSICAL EXAM: VS:  BP (!) 152/88 (BP Location: Left Arm, Patient Position: Sitting, Cuff Size: Normal)   Pulse 68   Ht 5\' 11"  (1.803 m)   Wt 193 lb 12 oz (87.9 kg)   BMI 27.02 kg/m  , Body mass index is 27.02 kg/m. Wt Readings from Last 3 Encounters:  12/08/16 193 lb 12 oz (87.9 kg)  09/08/16 188 lb (85.3 kg)  08/21/16 187 lb (84.8 kg)    GENERAL:  well developed,  well nourished, not in acute distress HEENT: normocephalic, pink conjunctivae, anicteric sclerae, no xanthelasma, normal dentition, oropharynx clear NECK:  no neck vein engorgement, JVP normal, no hepatojugular reflux, carotid upstroke brisk and symmetric, no bruit, no thyromegaly, no lymphadenopathy LUNGS:  good respiratory effort, clear to auscultation bilaterally CV:  PMI not displaced, no thrills, no lifts, S1 and S2 within normal limits, no palpable S3 or S4, no murmurs, no rubs, no gallops ABD:  Soft, nontender, nondistended, normoactive bowel  sounds, no abdominal aortic bruit, no hepatomegaly, no splenomegaly MS: nontender back, no kyphosis, no scoliosis, no joint deformities EXT:  2+ DP/PT pulses, no edema, no varicosities, no cyanosis, no clubbing SKIN: warm, nondiaphoretic, normal turgor, no ulcers NEUROPSYCH: alert, oriented to person, place, and time, sensory/motor grossly intact, normal mood, appropriate affect   Recent Labs: 08/13/2016: BUN 19; Creatinine, Ser 0.79; Hemoglobin 15.6; Magnesium 2.0; Platelets 346; Potassium 3.7; Sodium 139; TSH 1.311 08/18/2016: ALT 69   Lipid Panel No results found for: CHOL, TRIG, HDL, CHOLHDL, VLDL, LDLCALC, LDLDIRECT   Other studies Reviewed:  EKG:  The ekg from 08/18/2016 was personally reviewed by me and it revealed A. fib, RVR, 126 BPM, nonspecific ST-T changes.  EKG from 09/08/2016 was personally reviewed by me and it revealed sinus rhythm, 56 BPM.  Additional studies/ records that were reviewed personally reviewed by me today include:  Echo 08/28/2016: Left ventricle: The cavity size was normal. There was mild   concentric hypertrophy. Systolic function was normal. The   estimated ejection fraction was in the range of 60% to 65%. Wall   motion was normal; there were no regional wall motion   abnormalities. Left ventricular diastolic function parameters   were normal. - Mitral valve: There was mild regurgitation. - Left atrium: The atrium was mildly to moderately dilated. - Right atrium: The atrium was moderately dilated. - Tricuspid valve: There was mild regurgitation. - Pulmonary arteries: Systolic pressure was mildly increased. PA   peak pressure: 41 mm Hg (S).  TEE 08/21/2016: Left ventricle: Systolic function was likely normal. No evidence   of thrombus. - Mitral valve: There was moderate regurgitation. - Left atrium: No evidence of thrombus in the atrial cavity or   appendage. - Right atrium: No evidence of thrombus in the atrial cavity or   appendage. - Atrial  septum: No defect or patent foramen ovale was identified. - Tricuspid valve: There was moderate regurgitation directed   eccentrically. This was in the setting of elevated BP.  Impressions:  - Successful cardioversion. No cardiac source of emboli was identified   ASSESSMENT AND PLAN:  Atrial fibrillation, S/p CV 200 J 08/12/2016 Evidence of biatrial enlargement, normal LVEF. He completed course of Eliquis 5mg  bid for TEE/CV.  Recommend to continue aspirin 325 mg daily for CHADS2-VASc=0. Advised to follow-up and complete the CPAP titration study. Important to treat OSA in setting of history of A. fib. Patient verbalizes understanding and he will follow-up with his PCP for this. Patient to let us know if he has more episodes of palpitations, will recommend to do a monitor at that time. Abstinence from alcohol and smoking cessation recommended. Continue to monitor blood pressure.  Current medicines are reviewed at length with the patient today.  The patient does not have concerns regarding medicines.  Labs/ tests ordered today include:  Orders Placed This Encounter  Procedures  . EKG 12-Lead    I had a lengthy and detailed discussion with the patient regarding diagnoses, prognosis, diagnostic options, treatment options ,  and side effects of medications.   I counseled the patient on importance of lifestyle modification including heart healthy diet, regular physical activity .   Disposition:   FU with Cardiology in 6 months  I spent at least 25 minutes with the patient today and more than 50% of the time was spent counseling the patient and coordinating care.     Signed, Almond Lint, MD  12/08/2016 11:42 AM    [facility]  This note was generated in part with voice recognition software and I apologize for any typographical errors that were not detected and corrected.

## 2016-12-08 NOTE — Patient Instructions (Signed)
Medication Instructions:  Your physician has recommended you make the following change in your medication:  1. START Aspirin 325 mg once daily   Testing/Procedures: Your physician has requested that you regularly monitor and record your blood pressure readings at home. Please use the same machine at the same time of day to check your readings and record them to bring to your follow-up visit.  Please also follow up with your primary care provider regarding titration of your machine.   Follow-Up: Your physician wants you to follow-up in: 6 months. You will receive a reminder letter in the mail two months in advance. If you don't receive a letter, please call our office to schedule the follow-up appointment.  It was a pleasure seeing you today here in the office. Please do not hesitate to give us a call back if you have any further questions. [phone]  [place] CellarPamela A. RN, BSN

## 2018-02-16 ENCOUNTER — Ambulatory Visit (INDEPENDENT_AMBULATORY_CARE_PROVIDER_SITE_OTHER): Payer: No Typology Code available for payment source | Admitting: Orthopaedic Surgery

## 2018-02-16 ENCOUNTER — Ambulatory Visit (INDEPENDENT_AMBULATORY_CARE_PROVIDER_SITE_OTHER): Payer: No Typology Code available for payment source

## 2018-02-16 ENCOUNTER — Encounter (INDEPENDENT_AMBULATORY_CARE_PROVIDER_SITE_OTHER): Payer: Self-pay | Admitting: Orthopaedic Surgery

## 2018-02-16 VITALS — BP 156/87 | HR 64 | Ht 71.0 in | Wt 185.0 lb

## 2018-02-16 DIAGNOSIS — M5442 Lumbago with sciatica, left side: Secondary | ICD-10-CM

## 2018-02-16 NOTE — Progress Notes (Signed)
Office Visit Note/orthopedic consultation   Patient: Wesley Collins           Date of Birth: 09/29/1955           MRN: [id number] Visit Date: 02/16/2018              Requested by: Rolm Gala, MD [id number] [place], [place] [address] PCP: Rolm Gala, MD   Assessment & Plan: Visit Diagnoses:  1. Acute left-sided low back pain with left-sided sciatica     Plan: Patient is already back at work he likely had some minimal disc bulge which initially caused him significant pain with spasms.  He still has slight numbness in his foot.  Work slip given for regular work he can return if he has persistent problems.  I do not anticipate any impairment.  Thank you for the opportunity to see him in consultation.  Follow-Up Instructions: No follow-ups on file.   Orders:  Orders Placed This Encounter  Procedures  . XR Lumbar Spine 2-3 Views   No orders of the defined types were placed in this encounter.     Procedures: No procedures performed   Clinical Data: No additional findings.   Subjective: Chief Complaint  Patient presents with  . Lower Back - Pain    HPI 62 year old male here with Andris Flurry, RN, CCM for an on-the-job injury that occurred when he works at [employer].  He works in Furniture conservator/restorer and is walking around with a backpack sprayer slipped as he stepped in a hole and states he almost fell.  He had pain in his back which got worse he missed work on 01/05/2018.  He is treated with anti-inflammatories eventually had increased pain and was given some Percocet which worked well.  He is back at work now he states he still having a little bit of numbness in his left foot the entire foot but is able to walk and do his work.  The severe pain and spasms he had has resolved.  No fever chills no associated bowel bladder symptoms.  Patient's been treated by Dr. Maryjean Morn here for consultation.  Review of Systems positive for past history of A. fib which responded to cardioversion  he was on Eliquis short-term but not currently on it.  Near negative for urinary frequency or incontinence.  Positive for old history of left PIP central slip injury playing football.   Objective: Vital Signs: BP (!) 156/87   Pulse 64   Ht 5\' 11"  (1.803 m)   Wt 185 lb (83.9 kg)   BMI 25.80 kg/m   Physical Exam  Constitutional: He is oriented to person, place, and time. He appears well-developed and well-nourished.  HENT:  Head: Normocephalic and atraumatic.  Eyes: Pupils are equal, round, and reactive to light. EOM are normal.  Neck: No tracheal deviation present. No thyromegaly present.  Cardiovascular: Normal rate.  Pulmonary/Chest: Effort normal. He has no wheezes.  Abdominal: Soft. Bowel sounds are normal.  Neurological: He is alert and oriented to person, place, and time.  Skin: Skin is warm and dry. Capillary refill takes less than 2 seconds.  Psychiatric: He has a normal mood and affect. His behavior is normal. Judgment and thought content normal.    Ortho Exam positive straight leg raising minimal on the left.  He can heel and toe walk.  Knee and ankle jerk are 2+ and symmetrical.  Distal pulses are intact.  Negative logroll to the hips.  Specialty Comments:  No specialty comments  available.  Imaging: Xr Lumbar Spine 2-3 Views  Result Date: 02/16/2018 AP lateral lumbar x-rays are obtained and reviewed this shows multilevel disc space narrowing the lumbar spine without scoliosis.  No anterolisthesis.  Facet degenerative changes noted.  Negative for acute changes. Impression: Multilevel disc degeneration with disc space narrowing and endplate spurring.    PMFS History: Patient Active Problem List   Diagnosis Date Noted  . Acute left-sided low back pain with left-sided sciatica 02/16/2018   Past Medical History:  Diagnosis Date  . ADHD   . Anxiety   . Depression     Family History  Problem Relation Age of Onset  . Leukemia Mother   . Alzheimer's disease Father      Past Surgical History:  Procedure Laterality Date  . ELECTROPHYSIOLOGIC STUDY N/A 08/21/2016   Procedure: CARDIOVERSION;  Surgeon: Almond LintAileen Ingal, MD;  Location: [facility] ORS;  Service: Cardiovascular;  Laterality: N/A;  . TEE WITHOUT CARDIOVERSION N/A 08/21/2016   Procedure: TRANSESOPHAGEAL ECHOCARDIOGRAM (TEE);  Surgeon: Almond LintAileen Ingal, MD;  Location: [facility] ORS;  Service: Cardiovascular;  Laterality: N/A;   Social History   Occupational History  . Not on file  Tobacco Use  . Smoking status: Never Smoker  . Smokeless tobacco: Never Used  Substance and Sexual Activity  . Alcohol use: Yes    Comment: occassional   . Drug use: No  . Sexual activity: Yes

## 2018-04-30 IMAGING — DX DG CHEST 1V PORT
1 series · 1 of 1 positions shown · non-contrast
Comparison: None.

CLINICAL DATA: Palpitations, shortness of breath today

EXAM:
PORTABLE CHEST 1 VIEW

[chest ap]
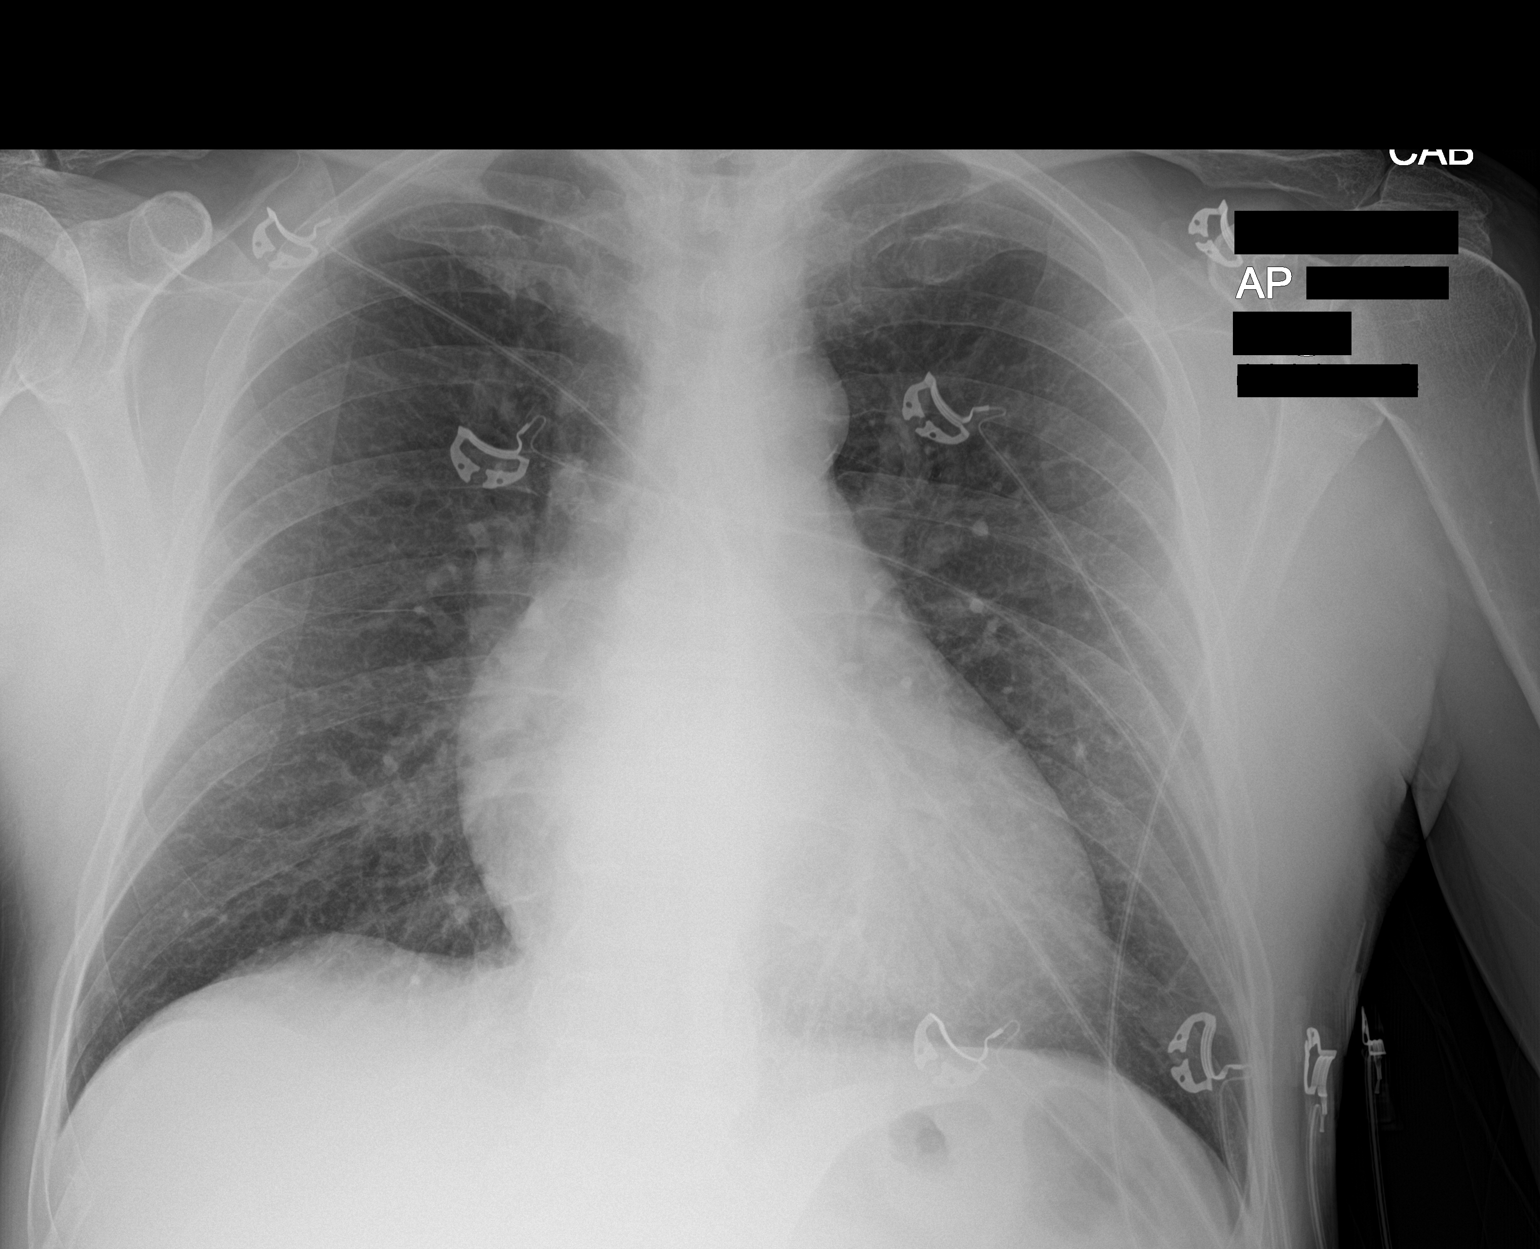

[1 of 1 positions shown; findings below may reference images not displayed]

FINDINGS: Borderline cardiomegaly. No infiltrate or pleural effusion. No
pulmonary edema. Mild degenerative changes bilateral AC joints.
IMPRESSION: No active disease.  Borderline cardiomegaly.

## 2018-09-13 DIAGNOSIS — G4733 Obstructive sleep apnea (adult) (pediatric): Secondary | ICD-10-CM | POA: Insufficient documentation

## 2018-10-13 DIAGNOSIS — I480 Paroxysmal atrial fibrillation: Secondary | ICD-10-CM | POA: Insufficient documentation

## 2018-10-13 DIAGNOSIS — I10 Essential (primary) hypertension: Secondary | ICD-10-CM | POA: Insufficient documentation

## 2018-12-27 ENCOUNTER — Telehealth: Payer: Self-pay

## 2018-12-27 NOTE — Telephone Encounter (Signed)
Called patient from recall list.  No answer. LMOV.  This is the 2nd attempt per recall list.   

## 2019-04-12 DIAGNOSIS — I4892 Unspecified atrial flutter: Secondary | ICD-10-CM | POA: Insufficient documentation

## 2019-04-14 ENCOUNTER — Other Ambulatory Visit: Payer: Self-pay

## 2019-04-14 ENCOUNTER — Other Ambulatory Visit
Admission: RE | Admit: 2019-04-14 | Discharge: 2019-04-14 | Disposition: A | Discharge status: Home / Self Care | Payer: 59 | Source: Ambulatory Visit | Attending: Internal Medicine | Admitting: Internal Medicine

## 2019-04-14 DIAGNOSIS — Z01812 Encounter for preprocedural laboratory examination: Secondary | ICD-10-CM | POA: Insufficient documentation

## 2019-04-14 DIAGNOSIS — Z20828 Contact with and (suspected) exposure to other viral communicable diseases: Secondary | ICD-10-CM | POA: Diagnosis not present

## 2019-04-15 LAB — SARS CORONAVIRUS 2 (TAT 6-24 HRS): SARS Coronavirus 2: NEGATIVE

## 2019-04-19 ENCOUNTER — Other Ambulatory Visit: Payer: Self-pay

## 2019-04-19 ENCOUNTER — Ambulatory Visit
Admission: RE | Admit: 2019-04-19 | Discharge: 2019-04-19 | Disposition: A | Discharge status: Home / Self Care | Payer: No Typology Code available for payment source | Attending: Internal Medicine | Admitting: Internal Medicine

## 2019-04-19 ENCOUNTER — Encounter: Payer: Self-pay | Admitting: *Deleted

## 2019-04-19 ENCOUNTER — Encounter
Admission: RE | Disposition: A | Discharge status: Home / Self Care | Payer: Self-pay | Source: Home / Self Care | Attending: Internal Medicine

## 2019-04-19 DIAGNOSIS — Z7901 Long term (current) use of anticoagulants: Secondary | ICD-10-CM | POA: Insufficient documentation

## 2019-04-19 DIAGNOSIS — Z79899 Other long term (current) drug therapy: Secondary | ICD-10-CM | POA: Insufficient documentation

## 2019-04-19 DIAGNOSIS — I10 Essential (primary) hypertension: Secondary | ICD-10-CM | POA: Insufficient documentation

## 2019-04-19 DIAGNOSIS — I480 Paroxysmal atrial fibrillation: Secondary | ICD-10-CM | POA: Diagnosis not present

## 2019-04-19 DIAGNOSIS — Z888 Allergy status to other drugs, medicaments and biological substances status: Secondary | ICD-10-CM | POA: Diagnosis not present

## 2019-04-19 DIAGNOSIS — Z5309 Procedure and treatment not carried out because of other contraindication: Secondary | ICD-10-CM | POA: Diagnosis not present

## 2019-04-19 DIAGNOSIS — I4892 Unspecified atrial flutter: Secondary | ICD-10-CM | POA: Insufficient documentation

## 2019-04-19 HISTORY — PX: CARDIOVERSION: SHX1299

## 2019-04-19 SURGERY — CARDIOVERSION
Anesthesia: General

## 2019-04-19 MED ORDER — PROPOFOL 10 MG/ML IV BOLUS
INTRAVENOUS | Status: AC
  Filled 2019-04-19: qty 20

## 2019-04-19 MED ORDER — SODIUM CHLORIDE 0.9 % IV SOLN: INTRAVENOUS | Status: DC

## 2019-04-19 NOTE — Progress Notes (Signed)
Patient is in sinus rhythm according to his 12 lead ekg today, dr. Nehemiah Massed in room currently discussing medications, ankle swelling, and plan of care with patient and his wife.  Cardioversion cancelled for today. Dr. Randa Lynn aware

## 2019-05-17 ENCOUNTER — Telehealth: Payer: Self-pay

## 2019-05-17 NOTE — Telephone Encounter (Signed)
Patient changed to Dr. Nehemiah Massed .  Deleting recall.

## 2019-11-22 DIAGNOSIS — E782 Mixed hyperlipidemia: Secondary | ICD-10-CM | POA: Insufficient documentation

## 2020-05-23 DIAGNOSIS — M1120 Other chondrocalcinosis, unspecified site: Secondary | ICD-10-CM | POA: Insufficient documentation

## 2022-04-17 ENCOUNTER — Emergency Department: Payer: No Typology Code available for payment source

## 2022-04-17 ENCOUNTER — Other Ambulatory Visit: Payer: Self-pay

## 2022-04-17 ENCOUNTER — Emergency Department
Admission: EM | Admit: 2022-04-17 | Discharge: 2022-04-17 | Disposition: A | Discharge status: Home / Self Care | Payer: No Typology Code available for payment source | Attending: Student in an Organized Health Care Education/Training Program | Admitting: Student in an Organized Health Care Education/Training Program

## 2022-04-17 DIAGNOSIS — R0602 Shortness of breath: Secondary | ICD-10-CM | POA: Diagnosis present

## 2022-04-17 DIAGNOSIS — I4891 Unspecified atrial fibrillation: Secondary | ICD-10-CM | POA: Diagnosis not present

## 2022-04-17 DIAGNOSIS — R61 Generalized hyperhidrosis: Secondary | ICD-10-CM | POA: Insufficient documentation

## 2022-04-17 LAB — BASIC METABOLIC PANEL
Anion gap: 10 (ref 5–15)
BUN: 22 mg/dL (ref 8–23)
CO2: 26 mmol/L (ref 22–32)
Calcium: 9.6 mg/dL (ref 8.9–10.3)
Chloride: 103 mmol/L (ref 98–111)
Creatinine, Ser: 0.95 mg/dL (ref 0.61–1.24)
GFR, Estimated: >60 mL/min (ref >60)
Glucose, Bld: 142 mg/dL — ABNORMAL HIGH (ref 70–99)
Potassium: 4 mmol/L (ref 3.5–5.1)
Sodium: 139 mmol/L (ref 135–145)

## 2022-04-17 LAB — TROPONIN I (HIGH SENSITIVITY): Troponin I (High Sensitivity): 6 ng/L (ref <18)

## 2022-04-17 LAB — CBC
HCT: 49.7 % (ref 39.0–52.0)
Hemoglobin: 16.7 g/dL (ref 13.0–17.0)
MCH: 29.9 pg (ref 26.0–34.0)
MCHC: 33.6 g/dL (ref 30.0–36.0)
MCV: 89.1 fL (ref 80.0–100.0)
Platelets: 379 10*3/uL (ref 150–400)
RBC: 5.58 MIL/uL (ref 4.22–5.81)
RDW: 13.3 % (ref 11.5–15.5)
WBC: 7.3 10*3/uL (ref 4.0–10.5)
nRBC: 0 % (ref 0.0–0.2)

## 2022-04-17 LAB — PROTIME-INR
INR: 1.1 (ref 0.8–1.2)
Prothrombin Time: 13.9 seconds (ref 11.4–15.2)

## 2022-04-17 MED ORDER — DILTIAZEM HCL ER COATED BEADS 240 MG PO CP24: 240.0000 mg | ORAL_CAPSULE | Freq: Every day | ORAL | 1 refills | Status: AC

## 2022-04-17 MED ORDER — DILTIAZEM HCL 25 MG/5ML IV SOLN
10.0000 mg | Freq: Once | INTRAVENOUS | Status: AC
  Administered 2022-04-17: 10 mg via INTRAVENOUS
  Filled 2022-04-17: qty 5

## 2022-04-17 MED ORDER — METOPROLOL TARTRATE 5 MG/5ML IV SOLN
5.0000 mg | Freq: Once | INTRAVENOUS | Status: AC
  Administered 2022-04-17: 5 mg via INTRAVENOUS
  Filled 2022-04-17: qty 5

## 2022-04-17 MED ORDER — DILTIAZEM HCL 60 MG PO TABS
60.0000 mg | ORAL_TABLET | Freq: Once | ORAL | Status: AC
  Administered 2022-04-17: 60 mg via ORAL
  Filled 2022-04-17: qty 1

## 2022-04-17 MED ORDER — SODIUM CHLORIDE 0.9 % IV BOLUS
500.0000 mL | Freq: Once | INTRAVENOUS | Status: AC
  Administered 2022-04-17: 500 mL via INTRAVENOUS

## 2022-04-17 MED ORDER — METOPROLOL TARTRATE 25 MG PO TABS
25.0000 mg | ORAL_TABLET | Freq: Once | ORAL | Status: AC
  Administered 2022-04-17: 25 mg via ORAL
  Filled 2022-04-17: qty 1

## 2022-04-17 NOTE — ED Provider Notes (Signed)
[facility] Provider Note    Event Date/Time   First MD Initiated Contact with Patient 04/17/22 [phone]     (approximate)   History   Atrial Fibrillation   HPI  Wesley Collins is a 66 y.o. male with a history of paroxysmal A-fib not currently taking his Eliquis presents to the ER for evaluation of shortness of breath diaphoresis.  States he was feeling weak and winded yesterday.  Thinks that his A-fib has been causing issues over the past few days.  Has been compliant with his metoprolol.  Denies any fevers chills.  No nausea or vomiting.  Denies any chest pain or discomfort at this time.     Physical Exam   Triage Vital Signs: ED Triage Vitals  Enc Vitals Group     BP 04/17/22 0923 (!) 142/93     Pulse Rate 04/17/22 0923 (!) 153     Resp 04/17/22 0923 (!) 21     Temp 04/17/22 0923 97.6 F (36.4 C)     Temp Source 04/17/22 0923 Oral     SpO2 04/17/22 0923 95 %     Weight 04/17/22 0917 184 lb 15.5 oz (83.9 kg)     Height 04/17/22 0917 5\' 11"  (1.803 m)     Head Circumference --      Peak Flow --      Pain Score 04/17/22 0917 0     Pain Loc --      Pain Edu? --      Excl. in GC? --     Most recent vital signs: Vitals:   04/17/22 1100 04/17/22 1130  BP: 109/75 99/82  Pulse: 98 100  Resp: (!) 22 16  Temp:    SpO2: 100% 98%     Constitutional: Alert  Eyes: Conjunctivae are normal.  Head: Atraumatic. Nose: No congestion/rhinnorhea. Mouth/Throat: Mucous membranes are moist.   Neck: Painless ROM.  Cardiovascular:   Irregularly irregular rhythm tachycardic rate. Respiratory: Normal respiratory effort.  No retractions.  Gastrointestinal: Soft and nontender.  Musculoskeletal:  no deformity Neurologic:  MAE spontaneously. No gross focal neurologic deficits are appreciated.  Skin:  Skin is warm, dry and intact. No rash noted. Psychiatric: Mood and affect are normal. Speech and behavior are normal.    ED Results / Procedures / Treatments    Labs (all labs ordered are listed, but only abnormal results are displayed) Labs Reviewed  BASIC METABOLIC PANEL - Abnormal; Notable for the following components:      Result Value   Glucose, Bld 142 (*)    All other components within normal limits  CBC  PROTIME-INR  TROPONIN I (HIGH SENSITIVITY)  TROPONIN I (HIGH SENSITIVITY)     EKG  ED ECG REPORT I, 04/19/22, the attending physician, personally viewed and interpreted this ECG.   Date: 04/17/2022  EKG Time: 9:18  Rate: 150  Rhythm: afib with rvr  Axis: normal  Intervals: normal qt  ST&T Change: nonspecific st abn, no stemi    RADIOLOGY Please see ED Course for my review and interpretation.  I personally reviewed all radiographic images ordered to evaluate for the above acute complaints and reviewed radiology reports and findings.  These findings were personally discussed with the patient.  Please see medical record for radiology report.    PROCEDURES:  Critical Care performed: Yes, see critical care procedure note(s)  .Critical Care  Performed by: 04/19/2022, MD Authorized by: Willy Eddy, MD   Critical care provider statement:  Critical care time (minutes):  35   Critical care was necessary to treat or prevent imminent or life-threatening deterioration of the following conditions:  Circulatory failure   Critical care was time spent personally by me on the following activities:  Ordering and performing treatments and interventions, ordering and review of laboratory studies, ordering and review of radiographic studies, pulse oximetry, re-evaluation of patient's condition, review of old charts, obtaining history from patient or surrogate, examination of patient, evaluation of patient's response to treatment, discussions with primary provider, discussions with consultants and development of treatment plan with patient or surrogate    MEDICATIONS ORDERED IN ED: Medications  diltiazem  (CARDIZEM) tablet 60 mg (has no administration in time range)  metoprolol tartrate (LOPRESSOR) injection 5 mg (5 mg Intravenous Given 04/17/22 0932)  sodium chloride 0.9 % bolus 500 mL (0 mLs Intravenous Stopped 04/17/22 1031)  metoprolol tartrate (LOPRESSOR) injection 5 mg (5 mg Intravenous Given 04/17/22 0945)  metoprolol tartrate (LOPRESSOR) tablet 25 mg (25 mg Oral Given 04/17/22 0942)  diltiazem (CARDIZEM) injection 10 mg (10 mg Intravenous Given 04/17/22 1029)     IMPRESSION / MDM / ASSESSMENT AND PLAN / ED COURSE  I reviewed the triage vital signs and the nursing notes.                              Differential diagnosis includes, but is not limited to, dysrhythmia, electrolyte abnormality, A-fib with RVR, anemia, sepsis, CHF  Patient presented to the ER for evaluation symptoms as described above.  This presenting complaint could reflect a potentially life-threatening illness therefore the patient will be placed on continuous pulse oximetry and telemetry for monitoring.  Laboratory evaluation will be sent to evaluate for the above complaints.  Patient is currently well perfused mentating well.  He is off his anticoagulation therefore cardioversion not an option at this time.  We will give IV fluids as well attempt beta-blocker.   Clinical Course as of 04/17/22 1136  Fri Apr 17, 2022  8416 Chest x-ray on my review and interpretation does not show any evidence of consolidation or pneumothorax. [PR]  1028 Patient's heart rates improved to the 1 teens 120s.  Discussed Case in consultation with cardiology.  Will give dose of IV Cardizem and if responds well we will add back his p.o. Cardizem.  He looks well enough that I think he be appropriate for outpatient follow-up. [PR]  1041 Heart rate significantly improved with Cardizem. [PR]    Clinical Course User Index [PR] Willy Eddy, MD    FINAL CLINICAL IMPRESSION(S) / ED DIAGNOSES   Final diagnoses:  Atrial fibrillation with RVR  (HCC)     Rx / DC Orders   ED Discharge Orders          Ordered    diltiazem (CARDIZEM CD) 240 MG 24 hr capsule  Daily at bedtime        04/17/22 1134             Note:  This document was prepared using Dragon voice recognition software and may include unintentional dictation errors.    Willy Eddy, MD 04/17/22 1136

## 2022-04-17 NOTE — ED Notes (Signed)
EDP at bedside. Pt to ED for a fib from [facility]. HR is 140s-150s. Pt states HR was 30s-40s this AM. Pt missed last dose of eliquis. Took metoprolol this AM, takes once daily. Pt states feels well but is clammy and feels slightly dizzy.

## 2022-04-17 NOTE — ED Triage Notes (Signed)
Pt here from [facility] with palpitations. Pt states he was feeling this yesterday. Pt is clammy, cold, and diaphoretic. Pt noted to be in a-fib.

## 2023-05-10 ENCOUNTER — Telehealth: Payer: Self-pay

## 2023-05-10 ENCOUNTER — Ambulatory Visit: Payer: No Typology Code available for payment source | Admitting: Dermatology

## 2023-05-10 NOTE — Telephone Encounter (Signed)
Left pt msg to call the office so we could reschedule his appointment that was scheduled for today.  Advised Dr. Katrinka Blazing is out of the office today./sh

## 2023-05-12 ENCOUNTER — Encounter: Payer: Self-pay | Admitting: Dermatology

## 2023-05-12 ENCOUNTER — Ambulatory Visit (INDEPENDENT_AMBULATORY_CARE_PROVIDER_SITE_OTHER): Payer: Medicare HMO | Admitting: Dermatology

## 2023-05-12 DIAGNOSIS — D1801 Hemangioma of skin and subcutaneous tissue: Secondary | ICD-10-CM

## 2023-05-12 DIAGNOSIS — L821 Other seborrheic keratosis: Secondary | ICD-10-CM | POA: Diagnosis not present

## 2023-05-12 DIAGNOSIS — L578 Other skin changes due to chronic exposure to nonionizing radiation: Secondary | ICD-10-CM

## 2023-05-12 DIAGNOSIS — L570 Actinic keratosis: Secondary | ICD-10-CM | POA: Diagnosis not present

## 2023-05-12 DIAGNOSIS — D229 Melanocytic nevi, unspecified: Secondary | ICD-10-CM

## 2023-05-12 DIAGNOSIS — D492 Neoplasm of unspecified behavior of bone, soft tissue, and skin: Secondary | ICD-10-CM

## 2023-05-12 DIAGNOSIS — D0439 Carcinoma in situ of skin of other parts of face: Secondary | ICD-10-CM | POA: Diagnosis not present

## 2023-05-12 DIAGNOSIS — L814 Other melanin hyperpigmentation: Secondary | ICD-10-CM | POA: Diagnosis not present

## 2023-05-12 DIAGNOSIS — D692 Other nonthrombocytopenic purpura: Secondary | ICD-10-CM

## 2023-05-12 DIAGNOSIS — Z1283 Encounter for screening for malignant neoplasm of skin: Secondary | ICD-10-CM | POA: Diagnosis not present

## 2023-05-12 DIAGNOSIS — W908XXA Exposure to other nonionizing radiation, initial encounter: Secondary | ICD-10-CM

## 2023-05-12 DIAGNOSIS — D485 Neoplasm of uncertain behavior of skin: Secondary | ICD-10-CM

## 2023-05-12 DIAGNOSIS — D099 Carcinoma in situ, unspecified: Secondary | ICD-10-CM

## 2023-05-12 HISTORY — DX: Carcinoma in situ, unspecified: D09.9

## 2023-05-12 NOTE — Patient Instructions (Signed)
Cryotherapy Aftercare  Wash gently with soap and water everyday.   Apply Vaseline and Band-Aid daily until healed.    Wound Care Instructions  Cleanse wound gently with soap and water once a day then pat dry with clean gauze. Apply a thin coat of Petrolatum (petroleum jelly, "Vaseline") over the wound (unless you have an allergy to this). We recommend that you use a new, sterile tube of Vaseline. Do not pick or remove scabs. Do not remove the yellow or white "healing tissue" from the base of the wound.  Cover the wound with fresh, clean, nonstick gauze and secure with paper tape. You may use Band-Aids in place of gauze and tape if the wound is small enough, but would recommend trimming much of the tape off as there is often too much. Sometimes Band-Aids can irritate the skin.  You should call the office for your biopsy report after 1 week if you have not already been contacted.  If you experience any problems, such as abnormal amounts of bleeding, swelling, significant bruising, significant pain, or evidence of infection, please call the office immediately.  FOR ADULT SURGERY PATIENTS: If you need something for pain relief you may take 1 extra strength Tylenol (acetaminophen) AND 2 Ibuprofen (200mg  each) together every 4 hours as needed for pain. (do not take these if you are allergic to them or if you have a reason you should not take them.) Typically, you may only need pain medication for 1 to 3 days.    Recommend daily broad spectrum sunscreen SPF 30+ to sun-exposed areas, reapply every 2 hours as needed. Call for new or changing lesions.  Staying in the shade or wearing long sleeves, sun glasses (UVA+UVB protection) and wide brim hats (4-inch brim around the entire circumference of the hat) are also recommended for sun protection.    Due to recent changes in healthcare laws, you may see results of your pathology and/or laboratory studies on MyChart before the doctors have had a chance to  review them. We understand that in some cases there may be results that are confusing or concerning to you. Please understand that not all results are received at the same time and often the doctors may need to interpret multiple results in order to provide you with the best plan of care or course of treatment. Therefore, we ask that you please give [place] 2 business days to thoroughly review all your results before contacting the office for clarification. Should we see a critical lab result, you will be contacted sooner.   If You Need Anything After Your Visit  If you have any questions or concerns for your doctor, please call our main line at [phone] and press option 4 to reach your doctor's medical assistant. If no one answers, please leave a voicemail as directed and we will return your call as soon as possible. Messages left after 4 pm will be answered the following business day.   You may also send [place] a message via MyChart. We typically respond to MyChart messages within 1-2 business days.  For prescription refills, please ask your pharmacy to contact our office. Our fax number is [phone].  If you have an urgent issue when the clinic is closed that cannot wait until the next business day, you can page your doctor at the number below.    Please note that while we do our best to be available for urgent issues outside of office hours, we are not available 24/7.   If  you have an urgent issue and are unable to reach [place], you may choose to seek medical care at your doctor's office, retail clinic, urgent care center, or emergency room.  If you have a medical emergency, please immediately call 911 or go to the emergency department.  Pager Numbers  - Dr. Gwen Pounds: [phone]  - Dr. Roseanne Reno: [phone]  - Dr. Katrinka Blazing: [phone]   In the event of inclement weather, please call our main line at [phone] for an update on the status of any delays or closures.  Dermatology  Medication Tips: Please keep the boxes that topical medications come in in order to help keep track of the instructions about where and how to use these. Pharmacies typically print the medication instructions only on the boxes and not directly on the medication tubes.   If your medication is too expensive, please contact our office at [phone] option 4 or send [place] a message through MyChart.   We are unable to tell what your co-pay for medications will be in advance as this is different depending on your insurance coverage. However, we may be able to find a substitute medication at lower cost or fill out paperwork to get insurance to cover a needed medication.   If a prior authorization is required to get your medication covered by your insurance company, please allow [place] 1-2 business days to complete this process.  Drug prices often vary depending on where the prescription is filled and some pharmacies may offer cheaper prices.  The website www.goodrx.com contains coupons for medications through different pharmacies. The prices here do not account for what the cost may be with help from insurance (it may be cheaper with your insurance), but the website can give you the price if you did not use any insurance.  - You can print the associated coupon and take it with your prescription to the pharmacy.  - You may also stop by our office during regular business hours and pick up a GoodRx coupon card.  - If you need your prescription sent electronically to a different pharmacy, notify our office through [facility] or by phone at [phone] option 4.

## 2023-05-12 NOTE — Progress Notes (Signed)
New Patient Visit   Subjective  Wesley Collins is a 67 y.o. 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 Biopsy was done at [facility] 09/09/22 and was not treated.   Patient also mentions rash with blisters at that started at right armpit 06/2022 and spread to groin. Patient was using hydrocortisone then was prescribed clobetasol ointment and eventually a prednisone taper. Rash resolved.   The patient has spots, moles and lesions to be evaluated, some may be new or changing and the patient may have concern these could be cancer.  Patient accompanied by wife who contributes to history.   The following portions of the chart were reviewed this encounter and updated as appropriate: medications, allergies, medical history  Review of Systems:  No other skin or systemic complaints except as noted in HPI or Assessment and Plan.  Objective  Well appearing patient in no apparent distress; mood and affect are within normal limits.   A focused examination was performed of the following areas: All skin waist up examined  Relevant exam findings are noted in the Assessment and Plan.  Central upper back 6 mm pink papule with keratin pearls R/o BCC      left cheek 6 mm keratotic papule R/o BCC     L cheek x 1, R shoulder x 1, central upper back x 1, R preauricular x 1, mid back x 2, left upper back x 1 (7) Erythematous thin papules/macules with gritty scale.     Assessment & Plan     Neoplasm of uncertain behavior of skin (2) Central upper back  Skin / nail biopsy Type of biopsy: tangential   Informed consent: discussed and consent obtained   Timeout: patient name, date of birth, surgical site, and procedure verified   Procedure prep:  Patient was prepped and draped in usual sterile fashion Prep type:  Isopropyl alcohol Anesthesia: the lesion was anesthetized in a standard fashion   Anesthetic:  1% lidocaine w/ epinephrine 1-100,000 buffered w/ 8.4%  NaHCO3 Instrument used: DermaBlade   Hemostasis achieved with: pressure and aluminum chloride   Outcome: patient tolerated procedure well   Post-procedure details: sterile dressing applied and wound care instructions given   Dressing type: bandage and petrolatum    Specimen 1 - Surgical pathology Differential Diagnosis: R/o BCC  Check Margins: No 6 mm pink papule with keratin pearls   left cheek  Skin / nail biopsy Type of biopsy: tangential   Informed consent: discussed and consent obtained   Timeout: patient name, date of birth, surgical site, and procedure verified   Procedure prep:  Patient was prepped and draped in usual sterile fashion Prep type:  Isopropyl alcohol Anesthesia: the lesion was anesthetized in a standard fashion   Anesthetic:  1% lidocaine w/ epinephrine 1-100,000 buffered w/ 8.4% NaHCO3 Instrument used: DermaBlade   Hemostasis achieved with: pressure and aluminum chloride   Outcome: patient tolerated procedure well   Post-procedure details: sterile dressing applied and wound care instructions given   Dressing type: bandage and petrolatum    Specimen 2 - Surgical pathology Differential Diagnosis: R/o BCC  Check Margins: No 6 mm keratotic papule   Unclear what location on back was biopsied as SCCis at [facility]. Patient's wife pointed out a location that patient agreed with. It has a lesion that does not look like residual SCCis. Biopsied to rule out SCC  AK (actinic keratosis) (7) L cheek x 1, R shoulder x 1, central upper back x 1,  R preauricular x 1, mid back x 2, left upper back x 1  Actinic keratoses are precancerous spots that appear secondary to cumulative UV radiation exposure/sun exposure over time. They are chronic with expected duration over 1 year. A portion of actinic keratoses will progress to squamous cell carcinoma of the skin. It is not possible to reliably predict which spots will progress to skin cancer and so treatment is recommended to  prevent development of skin cancer.  Recommend daily broad spectrum sunscreen SPF 30+ to sun-exposed areas, reapply every 2 hours as needed.  Recommend staying in the shade or wearing long sleeves, sun glasses (UVA+UVB protection) and wide brim hats (4-inch brim around the entire circumference of the hat). Call for new or changing lesions.   Destruction of lesion - L cheek x 1, R shoulder x 1, central upper back x 1, R preauricular x 1, mid back x 2, left upper back x 1 (7) Complexity: simple   Destruction method: cryotherapy   Informed consent: discussed and consent obtained   Timeout:  patient name, date of birth, surgical site, and procedure verified Lesion destroyed using liquid nitrogen: Yes   Region frozen until ice ball extended beyond lesion: Yes   Cryo cycles: 1 or 2. Outcome: patient tolerated procedure well with no complications   Post-procedure details: wound care instructions given    Multiple benign nevi  Seborrheic keratoses  Lentigines  Actinic elastosis  Cherry angioma  Solar purpura (HCC)  ACTINIC DAMAGE - chronic, secondary to cumulative UV radiation exposure/sun exposure over time - diffuse scaly erythematous macules with underlying dyspigmentation - Recommend daily broad spectrum sunscreen SPF 30+ to sun-exposed areas, reapply every 2 hours as needed.  - Recommend staying in the shade or wearing long sleeves, sun glasses (UVA+UVB protection) and wide brim hats (4-inch brim around the entire circumference of the hat). - Call for new or changing lesions.  LENTIGINES, SEBORRHEIC KERATOSES, HEMANGIOMAS - Benign normal skin lesions - Benign-appearing - Call for any changes  MELANOCYTIC NEVI - Tan-brown and/or pink-flesh-colored symmetric macules and papules - Benign appearing on exam today - Observation - Call clinic for new or changing moles - Recommend daily use of broad spectrum spf 30+ sunscreen to sun-exposed areas.   ACTINIC DAMAGE - Chronic  condition, secondary to cumulative UV/sun exposure - diffuse scaly erythematous macules with underlying dyspigmentation - Recommend daily broad spectrum sunscreen SPF 30+ to sun-exposed areas, reapply every 2 hours as needed.  - Staying in the shade or wearing long sleeves, sun glasses (UVA+UVB protection) and wide brim hats (4-inch brim around the entire circumference of the hat) are also recommended for sun protection.  - Call for new or changing lesions.  SKIN CANCER SCREENING PERFORMED TODAYLENTIGINES, SEBORRHEIC KERATOSES, HEMANGIOMAS - Benign normal skin lesions - Benign-appearing - Call for any changes  MELANOCYTIC NEVI - Tan-brown and/or pink-flesh-colored symmetric macules and papules - Benign appearing on exam today - Observation - Call clinic for new or changing moles - Recommend daily use of broad spectrum spf 30+ sunscreen to sun-exposed areas.   ACTINIC DAMAGE - Chronic condition, secondary to cumulative UV/sun exposure - diffuse scaly erythematous macules with underlying dyspigmentation - Recommend daily broad spectrum sunscreen SPF 30+ to sun-exposed areas, reapply every 2 hours as needed.  - Staying in the shade or wearing long sleeves, sun glasses (UVA+UVB protection) and wide brim hats (4-inch brim around the entire circumference of the hat) are also recommended for sun protection.  - Call for new or changing  lesions.  SKIN CANCER SCREENING PERFORMED TODAY   Return in about 6 months (around 11/10/2023) for TBSE, with Dr. Katrinka Blazing.  Anise Salvo, RMA, am acting as scribe for Elie Goody, MD .   Documentation: I have reviewed the above documentation for accuracy and completeness, and I agree with the above.  Elie Goody, MD

## 2023-05-17 LAB — SURGICAL PATHOLOGY

## 2023-05-19 ENCOUNTER — Telehealth: Payer: Self-pay

## 2023-05-19 NOTE — Telephone Encounter (Signed)
-----   Message from [facility] sent at 05/19/2023 11:14 AM EDT ----- Diagnosis  1. Skin, central upper back :       HYPERTROPHIC ACTINIC KERATOSIS        2. 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  For BACK Explanation: biopsy shows a precancer (actinic keratosis). These usually resolve after being biopsied, so no further treatment is needed. We will recheck at next skin check. If the area becomes raised, rough, painful, or bleeds as it heals, contact us  For left CHEEK Explanation: This is a squamous cell skin cancer limited to the top layer of skin. This means it is an early cancer and has not spread. However, it has the potential to spread beyond the skin and threaten your health, so we recommend treating it.   Treatment: a cream (fluorouracil and calcipotriene) that helps your immune system clear the skin cancer. It will cause redness and irritation. Wait two weeks after the biopsy to start applying the cream. Apply the cream twice per day until the redness and irritation develop (usually occurs by day 7), then stop and allow it to heal. We will recheck the area in 2 months to ensure the cancer is gone. The cream is $35 plus shipping and will be mailed to you from a low cost compounding pharmacy.

## 2023-05-19 NOTE — Telephone Encounter (Signed)
Called patient. N/A. LMOVM to return my call.

## 2023-05-20 ENCOUNTER — Telehealth: Payer: Self-pay

## 2023-05-20 MED ORDER — FLUOROURACIL 5 % EX CREA: TOPICAL_CREAM | CUTANEOUS | 2 refills | Status: AC

## 2023-05-20 NOTE — Telephone Encounter (Signed)
-----   Message from [facility] sent at 05/19/2023 11:14 AM EDT ----- Diagnosis  1. Skin, central upper back :       HYPERTROPHIC ACTINIC KERATOSIS        2. 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  For BACK Explanation: biopsy shows a precancer (actinic keratosis). These usually resolve after being biopsied, so no further treatment is needed. We will recheck at next skin check. If the area becomes raised, rough, painful, or bleeds as it heals, contact us  For left CHEEK Explanation: This is a squamous cell skin cancer limited to the top layer of skin. This means it is an early cancer and has not spread. However, it has the potential to spread beyond the skin and threaten your health, so we recommend treating it.   Treatment: a cream (fluorouracil and calcipotriene) that helps your immune system clear the skin cancer. It will cause redness and irritation. Wait two weeks after the biopsy to start applying the cream. Apply the cream twice per day until the redness and irritation develop (usually occurs by day 7), then stop and allow it to heal. We will recheck the area in 2 months to ensure the cancer is gone. The cream is $35 plus shipping and will be mailed to you from a low cost compounding pharmacy.

## 2023-05-20 NOTE — Telephone Encounter (Signed)
Returned patient's call. N/A. LMOVM to return my call.

## 2023-05-20 NOTE — Telephone Encounter (Signed)
Called patient. Discussed pathology results and treatment plan with him and his wife.   5FU/Calcipotriene cream sent to [facility].  Apply the cream twice per day until the redness and irritation develop (usually occurs by day 7), then stop and allow it to heal. We will recheck the area in 2 months to ensure the cancer is gone. The cream is $35 plus shipping and will be mailed to you from a low cost compounding pharmacy.   Reviewed course of treatment and expected reaction.  Patient advised to expect inflammation and crusting and advised that erosions are possible.  Patient advised to be diligent with sun protection during and after treatment. Counseled to keep medication out of reach of children and pets.

## 2023-07-12 ENCOUNTER — Ambulatory Visit (INDEPENDENT_AMBULATORY_CARE_PROVIDER_SITE_OTHER): Payer: Medicare HMO | Admitting: Dermatology

## 2023-07-12 ENCOUNTER — Encounter: Payer: Self-pay | Admitting: Dermatology

## 2023-07-12 DIAGNOSIS — C44310 Basal cell carcinoma of skin of unspecified parts of face: Secondary | ICD-10-CM

## 2023-07-12 DIAGNOSIS — Z5189 Encounter for other specified aftercare: Secondary | ICD-10-CM

## 2023-07-12 NOTE — Progress Notes (Signed)
   Follow-Up Visit   Subjective  Wesley Collins is a 67 y.o. male who presents for the following: 2 month follow up. Bx proven SCC on left cheek. Bx: 05/12/2023. Has not Tx with 5FU/Calcipotriene cream yet. Received cream right before going to [place] for 2 weeks and is leaving for a cruise this Saturday. Will be back January 4th.  States he will start treatment when he returns from cruise.   The patient has spots, moles and lesions to be evaluated, some may be new or changing and the patient may have concern these could be cancer.   The following portions of the chart were reviewed this encounter and updated as appropriate: medications, allergies, medical history  Review of Systems:  No other skin or systemic complaints except as noted in HPI or Assessment and Plan.  Objective  Well appearing patient in no apparent distress; mood and affect are within normal limits.  A focused examination was performed of the following areas: Face, left cheek  Relevant exam findings are noted in the Assessment and Plan.    Assessment & Plan   Wound Check/SCC  Exam: Pink macule at left mid cheek.   Treatment: Begin 5FU/Calcipotriene once home from cruise. Apply the cream twice per day until the redness and irritation develop (usually occurs by day 7), then stop and allow it to heal.   5-fluorouracil/calcipotriene cream is is a type of field treatment used to treat precancers, thin skin cancers, and areas of sun damage. Expected reaction includes irritation and mild inflammation potentially progressing to more severe inflammation including redness, scaling, crusting and open sores/erosions.  If too much irritation occurs, ensure application of only a thin layer and decrease frequency of use to achieve a tolerable level of inflammation. Recommend applying Vaseline ointment to open sores as needed.  Minimize sun exposure while under treatment. Recommend daily broad spectrum sunscreen SPF 30+ to  sun-exposed areas, reapply every 2 hours as needed.       Visit for wound check    Return for [facility] recheck in 2-3 months.  I, Lawson Radar, CMA, am acting as scribe for Elie Goody, MD.   Documentation: I have reviewed the above documentation for accuracy and completeness, and I agree with the above.  Elie Goody, MD

## 2023-07-12 NOTE — Patient Instructions (Signed)
Apply the cream twice per day until the redness and irritation develop (usually occurs by day 7), then stop and allow it to heal.    5-fluorouracil/calcipotriene cream is is a type of field treatment used to treat precancers, thin skin cancers, and areas of sun damage. Expected reaction includes irritation and mild inflammation potentially progressing to more severe inflammation including redness, scaling, crusting and open sores/erosions.  If too much irritation occurs, ensure application of only a thin layer and decrease frequency of use to achieve a tolerable level of inflammation. Recommend applying Vaseline ointment to open sores as needed.  Minimize sun exposure while under treatment. Recommend daily broad spectrum sunscreen SPF 30+ to sun-exposed areas, reapply every 2 hours as needed.      Recommend daily broad spectrum sunscreen SPF 30+ to sun-exposed areas, reapply every 2 hours as needed. Call for new or changing lesions.  Staying in the shade or wearing long sleeves, sun glasses (UVA+UVB protection) and wide brim hats (4-inch brim around the entire circumference of the hat) are also recommended for sun protection.     Due to recent changes in healthcare laws, you may see results of your pathology and/or laboratory studies on MyChart before the doctors have had a chance to review them. We understand that in some cases there may be results that are confusing or concerning to you. Please understand that not all results are received at the same time and often the doctors may need to interpret multiple results in order to provide you with the best plan of care or course of treatment. Therefore, we ask that you please give [place] 2 business days to thoroughly review all your results before contacting the office for clarification. Should we see a critical lab result, you will be contacted sooner.   If You Need Anything After Your Visit  If you have any questions or concerns for your doctor, please  call our main line at [phone] and press option 4 to reach your doctor's medical assistant. If no one answers, please leave a voicemail as directed and we will return your call as soon as possible. Messages left after 4 pm will be answered the following business day.   You may also send [place] a message via MyChart. We typically respond to MyChart messages within 1-2 business days.  For prescription refills, please ask your pharmacy to contact our office. Our fax number is [phone].  If you have an urgent issue when the clinic is closed that cannot wait until the next business day, you can page your doctor at the number below.    Please note that while we do our best to be available for urgent issues outside of office hours, we are not available 24/7.   If you have an urgent issue and are unable to reach [place], you may choose to seek medical care at your doctor's office, retail clinic, urgent care center, or emergency room.  If you have a medical emergency, please immediately call 911 or go to the emergency department.  Pager Numbers  - Dr. Gwen Pounds: [phone]  - Dr. Roseanne Reno: [phone]  - Dr. Katrinka Blazing: [phone]   In the event of inclement weather, please call our main line at [phone] for an update on the status of any delays or closures.  Dermatology Medication Tips: Please keep the boxes that topical medications come in in order to help keep track of the instructions about where and how to use these. Pharmacies typically print the medication instructions only on  the boxes and not directly on the medication tubes.   If your medication is too expensive, please contact our office at [phone] option 4 or send [place] a message through MyChart.   We are unable to tell what your co-pay for medications will be in advance as this is different depending on your insurance coverage. However, we may be able to find a substitute medication at lower cost or fill out paperwork to get  insurance to cover a needed medication.   If a prior authorization is required to get your medication covered by your insurance company, please allow [place] 1-2 business days to complete this process.  Drug prices often vary depending on where the prescription is filled and some pharmacies may offer cheaper prices.  The website www.goodrx.com contains coupons for medications through different pharmacies. The prices here do not account for what the cost may be with help from insurance (it may be cheaper with your insurance), but the website can give you the price if you did not use any insurance.  - You can print the associated coupon and take it with your prescription to the pharmacy.  - You may also stop by our office during regular business hours and pick up a GoodRx coupon card.  - If you need your prescription sent electronically to a different pharmacy, notify our office through [facility] or by phone at [phone] option 4.     Si Usted Necesita Algo Despus de Su Visita  Tambin puede enviarnos un mensaje a travs de Clinical cytogeneticist. Por lo general respondemos a los mensajes de MyChart en el transcurso de 1 a 2 das hbiles.  Para renovar recetas, por favor pida a su farmacia que se ponga en contacto con nuestra oficina. Annie Sable de fax es No Name [phone].  Si tiene un asunto urgente cuando la clnica est cerrada y que no puede esperar hasta el siguiente da hbil, puede llamar/localizar a su doctor(a) al nmero que aparece a continuacin.   Por favor, tenga en cuenta que aunque hacemos todo lo posible para estar disponibles para asuntos urgentes fuera del horario de [place], no estamos disponibles las 24 horas del da, los 7 [address]  [address] de la [place].   Si tiene un problema urgente y no puede comunicarse con nosotros, puede optar por buscar atencin mdica  en el consultorio de su doctor(a), en una clnica privada, en un centro de atencin urgente o en una sala de emergencias.  Si  tiene Engineer, drilling, por favor llame inmediatamente al 911 o vaya a la sala de emergencias.  Nmeros de bper  - Dr. Gwen Pounds: [phone]  - Dra. Roseanne Reno: [phone]  - Dr. Katrinka Blazing: [phone]   En caso de inclemencias del tiempo, por favor llame a Lacy Duverney principal al [phone] para una actualizacin sobre el [place] de cualquier retraso o cierre.  Consejos para la medicacin en dermatologa: Por favor, guarde las cajas en las que vienen los medicamentos de uso tpico para ayudarle a seguir las instrucciones sobre dnde y cmo usarlos. Las farmacias generalmente imprimen las instrucciones del medicamento slo en las cajas y no directamente en los tubos del [place].   Si su medicamento es muy caro, por favor, pngase en contacto con Rolm Gala llamando al [phone] y presione la opcin 4 o envenos un mensaje a travs de Clinical cytogeneticist.   No podemos decirle cul ser su copago por los medicamentos por adelantado ya que esto es diferente dependiendo de la cobertura de su seguro. Sin embargo, es posible  que podamos encontrar un medicamento sustituto a Audiological scientist un formulario para que el seguro cubra el medicamento que se considera necesario.   Si se requiere una autorizacin previa para que su compaa de seguros [place] su medicamento, por favor permtanos de 1 a 2 das hbiles para completar [address]  Los precios de los medicamentos varan con frecuencia dependiendo del Environmental consultant de dnde se surte la receta y alguna farmacias pueden ofrecer precios ms baratos.  El sitio web www.goodrx.com tiene cupones para medicamentos de Health and safety inspector. Los precios aqu no tienen en cuenta lo que podra costar con la ayuda del seguro (puede ser ms barato con su seguro), pero el sitio web puede darle el precio si no utiliz Tourist information centre manager.  - Puede imprimir el cupn correspondiente y llevarlo con su receta a la farmacia.  - Tambin puede pasar por nuestra oficina  durante el horario de atencin regular y Education officer, museum una tarjeta de cupones de GoodRx.  - Si necesita que su receta se enve electrnicamente a una farmacia diferente, informe a nuestra oficina a travs de MyChart de [place] o por telfono llamando al [phone] y presione la opcin 4.

## 2023-09-30 ENCOUNTER — Encounter: Payer: Self-pay | Admitting: Dermatology

## 2023-09-30 ENCOUNTER — Ambulatory Visit: Payer: Medicare Other | Admitting: Dermatology

## 2023-09-30 DIAGNOSIS — W908XXA Exposure to other nonionizing radiation, initial encounter: Secondary | ICD-10-CM | POA: Diagnosis not present

## 2023-09-30 DIAGNOSIS — Z5111 Encounter for antineoplastic chemotherapy: Secondary | ICD-10-CM

## 2023-09-30 DIAGNOSIS — L570 Actinic keratosis: Secondary | ICD-10-CM

## 2023-09-30 DIAGNOSIS — L578 Other skin changes due to chronic exposure to nonionizing radiation: Secondary | ICD-10-CM | POA: Diagnosis not present

## 2023-09-30 DIAGNOSIS — Z86007 Personal history of in-situ neoplasm of skin: Secondary | ICD-10-CM

## 2023-09-30 NOTE — Patient Instructions (Addendum)
 Start 5-fluorouracil/calcipotriene cream twice a day for ~7 days or until gets red and irritated to affected areas including posterior neck, L and R neck, scalp, treating 1 area at a time.  5-Fluorouracil/Calcipotriene Patient Education   Actinic keratoses are the dry, red scaly spots on the skin caused by sun damage. A portion of these spots can turn into skin cancer with time, and treating them can help prevent development of skin cancer.   Treatment of these spots requires removal of the defective skin cells. There are various ways to remove actinic keratoses, including freezing with liquid nitrogen, treatment with creams, or treatment with a blue light procedure in the office.   5-fluorouracil cream is a topical cream used to treat actinic keratoses. It works by interfering with the growth of abnormal fast-growing skin cells, such as actinic keratoses. These cells peel off and are replaced by healthy ones.   5-fluorouracil/calcipotriene is a combination of the 5-fluorouracil cream with a vitamin D analog cream called calcipotriene. The calcipotriene alone does not treat actinic keratoses. However, when it is combined with 5-fluorouracil, it helps the 5-fluorouracil treat the actinic keratoses much faster so that the same results can be achieved with a much shorter treatment time.  INSTRUCTIONS FOR 5-FLUOROURACIL/CALCIPOTRIENE CREAM:   5-fluorouracil/calcipotriene cream typically only needs to be used for 4-7 days. A thin layer should be applied twice a day to the treatment areas recommended by your physician.   If your physician prescribed you separate tubes of 5-fluourouracil and calcipotriene, apply a thin layer of 5-fluorouracil followed by a thin layer of calcipotriene.   Avoid contact with your eyes, nostrils, and mouth. Do not use 5-fluorouracil/calcipotriene cream on infected or open wounds.   You will develop redness, irritation and some crusting at areas where you have pre-cancer  damage/actinic keratoses. IF YOU DEVELOP PAIN, BLEEDING, OR SIGNIFICANT CRUSTING, STOP THE TREATMENT EARLY - you have already gotten a good response and the actinic keratoses should clear up well.  Wash your hands after applying 5-fluorouracil 5% cream on your skin.   A moisturizer or sunscreen with a minimum SPF 30 should be applied each morning.   Once you have finished the treatment, you can apply a thin layer of Vaseline twice a day to irritated areas to soothe and calm the areas more quickly. If you experience significant discomfort, contact your physician.  For some patients it is necessary to repeat the treatment for best results.  SIDE EFFECTS: When using 5-fluorouracil/calcipotriene cream, you may have mild irritation, such as redness, dryness, swelling, or a mild burning sensation. This usually resolves within 2 weeks. The more actinic keratoses you have, the more redness and inflammation you can expect during treatment. Eye irritation has been reported rarely. If this occurs, please let us know.  If you have any trouble using this cream, please call the office. If you have any other questions about this information, please do not hesitate to ask me before you leave the office.    Due to recent changes in healthcare laws, you may see results of your pathology and/or laboratory studies on MyChart before the doctors have had a chance to review them. We understand that in some cases there may be results that are confusing or concerning to you. Please understand that not all results are received at the same time and often the doctors may need to interpret multiple results in order to provide you with the best plan of care or course of treatment. Therefore, we ask that you  please give [place] 2 business days to thoroughly review all your results before contacting the office for clarification. Should we see a critical lab result, you will be contacted sooner.   If You Need Anything After Your  Visit  If you have any questions or concerns for your doctor, please call our main line at [phone] and press option 4 to reach your doctor's medical assistant. If no one answers, please leave a voicemail as directed and we will return your call as soon as possible. Messages left after 4 pm will be answered the following business day.   You may also send [place] a message via MyChart. We typically respond to MyChart messages within 1-2 business days.  For prescription refills, please ask your pharmacy to contact our office. Our fax number is [phone].  If you have an urgent issue when the clinic is closed that cannot wait until the next business day, you can page your doctor at the number below.    Please note that while we do our best to be available for urgent issues outside of office hours, we are not available 24/7.   If you have an urgent issue and are unable to reach [place], you may choose to seek medical care at your doctor's office, retail clinic, urgent care center, or emergency room.  If you have a medical emergency, please immediately call 911 or go to the emergency department.  Pager Numbers  - Dr. Gwen Pounds: [phone]  - Dr. Roseanne Reno: [phone]  - Dr. Katrinka Blazing: [phone]   In the event of inclement weather, please call our main line at [phone] for an update on the status of any delays or closures.  Dermatology Medication Tips: Please keep the boxes that topical medications come in in order to help keep track of the instructions about where and how to use these. Pharmacies typically print the medication instructions only on the boxes and not directly on the medication tubes.   If your medication is too expensive, please contact our office at [phone] option 4 or send [place] a message through MyChart.   We are unable to tell what your co-pay for medications will be in advance as this is different depending on your insurance coverage. However, we may be able to find a  substitute medication at lower cost or fill out paperwork to get insurance to cover a needed medication.   If a prior authorization is required to get your medication covered by your insurance company, please allow [place] 1-2 business days to complete this process.  Drug prices often vary depending on where the prescription is filled and some pharmacies may offer cheaper prices.  The website www.goodrx.com contains coupons for medications through different pharmacies. The prices here do not account for what the cost may be with help from insurance (it may be cheaper with your insurance), but the website can give you the price if you did not use any insurance.  - You can print the associated coupon and take it with your prescription to the pharmacy.  - You may also stop by our office during regular business hours and pick up a GoodRx coupon card.  - If you need your prescription sent electronically to a different pharmacy, notify our office through [facility] or by phone at [phone] option 4.     Si Usted Necesita Algo Despus de Su Visita  Tambin puede enviarnos un mensaje a travs de Clinical cytogeneticist. Por lo general respondemos a los mensajes de Psychologist, sport and exercise transcurso  de 1 a 2 das hbiles.  Para renovar recetas, por favor pida a su farmacia que se ponga en contacto con nuestra oficina. Annie Sable de fax es [place] [phone].  Si tiene un asunto urgente cuando la clnica est cerrada y que no puede esperar hasta el siguiente da hbil, puede llamar/localizar a su doctor(a) al nmero que aparece a continuacin.   Por favor, tenga en cuenta que aunque hacemos todo lo posible para estar disponibles para asuntos urgentes fuera del horario de [place], no estamos disponibles las 24 horas del da, los 7 [address]  [address] de la [place].   Si tiene un problema urgente y no puede comunicarse con nosotros, puede optar por buscar atencin mdica  en el consultorio de su doctor(a), en una clnica privada, en un  centro de atencin urgente o en una sala de emergencias.  Si tiene Engineer, drilling, por favor llame inmediatamente al 911 o vaya a la sala de emergencias.  Nmeros de bper  - Dr. Gwen Pounds: [phone]  - Dra. Roseanne Reno: [phone]  - Dr. Katrinka Blazing: [phone]   En caso de inclemencias del tiempo, por favor llame a Lacy Duverney principal al [phone] para una actualizacin sobre el [employer] de cualquier retraso o cierre.  Consejos para la medicacin en dermatologa: Por favor, guarde las cajas en las que vienen los medicamentos de uso tpico para ayudarle a seguir las instrucciones sobre dnde y cmo usarlos. Las farmacias generalmente imprimen las instrucciones del medicamento slo en las cajas y no directamente en los tubos del [place].   Si su medicamento es muy caro, por favor, pngase en contacto con Rolm Gala llamando al [phone] y presione la opcin 4 o envenos un mensaje a travs de Clinical cytogeneticist.   No podemos decirle cul ser su copago por los medicamentos por adelantado ya que esto es diferente dependiendo de la cobertura de su seguro. Sin embargo, es posible que podamos encontrar un medicamento sustituto a Audiological scientist un formulario para que el seguro cubra el medicamento que se considera necesario.   Si se requiere una autorizacin previa para que su compaa de seguros [place] su medicamento, por favor permtanos de 1 a 2 das hbiles para completar [address]  Los precios de los medicamentos varan con frecuencia dependiendo del Environmental consultant de dnde se surte la receta y alguna farmacias pueden ofrecer precios ms baratos.  El sitio web www.goodrx.com tiene cupones para medicamentos de Health and safety inspector. Los precios aqu no tienen en cuenta lo que podra costar con la ayuda del seguro (puede ser ms barato con su seguro), pero el sitio web puede darle el precio si no utiliz Tourist information centre manager.  - Puede imprimir el cupn correspondiente y llevarlo con su  receta a la farmacia.  - Tambin puede pasar por nuestra oficina durante el horario de atencin regular y Education officer, museum una tarjeta de cupones de GoodRx.  - Si necesita que su receta se enve electrnicamente a una farmacia diferente, informe a nuestra oficina a travs de MyChart de [place] o por telfono llamando al [phone] y presione la opcin 4.

## 2023-09-30 NOTE — Progress Notes (Signed)
 Follow-Up Visit   Subjective  Wesley Collins is a 68 y.o. male who presents for the following: Hx of SCC L mid cheek, 48m f/u, Used 5FU/Calcipotriene cr bid x 7 days, good reaction to cream The patient has spots, moles and lesions to be evaluated, some may be new or changing and the patient may have concern these could be cancer.   The following portions of the chart were reviewed this encounter and updated as appropriate: medications, allergies, medical history  Review of Systems:  No other skin or systemic complaints except as noted in HPI or Assessment and Plan.  Objective  Well appearing patient in no apparent distress; mood and affect are within normal limits.   A focused examination was performed of the following areas: face  Relevant exam findings are noted in the Assessment and Plan.    Assessment & Plan   HISTORY OF SCCis L CHEEK Exam: clear today  Treatment Plan: Clear, good results with 5FU/Calcipotriene cream  ACTINIC DAMAGE WITH PRECANCEROUS ACTINIC KERATOSES Counseling for Topical Chemotherapy Management: Patient exhibits: - Severe, confluent actinic changes with pre-cancerous actinic keratoses that is secondary to cumulative UV radiation exposure over time - Condition that is severe; chronic, not at goal. - diffuse scaly erythematous macules and papules with underlying dyspigmentation - Discussed Prescription "Field Treatment" topical Chemotherapy for Severe, Chronic Confluent Actinic Changes with Pre-Cancerous Actinic Keratoses Field treatment involves treatment of an entire area of skin that has confluent Actinic Changes (Sun/ Ultraviolet light damage) and PreCancerous Actinic Keratoses by method of PhotoDynamic Therapy (PDT) and/or prescription Topical Chemotherapy agents such as 5-fluorouracil, 5-fluorouracil/calcipotriene, and/or imiquimod.  The purpose is to decrease the number of clinically evident and subclinical PreCancerous lesions to prevent  progression to development of skin cancer by chemically destroying early precancer changes that may or may not be visible.  It has been shown to reduce the risk of developing skin cancer in the treated area. As a result of treatment, redness, scaling, crusting, and open sores may occur during treatment course. One or more than one of these methods may be used and may have to be used several times to control, suppress and eliminate the PreCancerous changes. Discussed treatment course, expected reaction, and possible side effects. - Recommend daily broad spectrum sunscreen SPF 30+ to sun-exposed areas, reapply every 2 hours as needed.  - Staying in the shade or wearing long sleeves, sun glasses (UVA+UVB protection) and wide brim hats (4-inch brim around the entire circumference of the hat) are also recommended. - Call for new or changing lesions.  - Start 5-fluorouracil/calcipotriene cream twice a day for ~7 days or until gets red and irritated to affected areas including posterior neck, L and R neck, scalp, treating 1 area at a time.  Patient provided with handout reviewing treatment course and side effects and advised to call or message [place] on MyChart with any concerns.  Reviewed course of treatment and expected reaction.  Patient advised to expect inflammation and crusting and advised that erosions are possible.  Patient advised to be diligent with sun protection during and after treatment. Counseled to keep medication out of reach of children and pets.   ACTINIC KERATOSES   ACTINIC ELASTOSIS   HISTORY OF SQUAMOUS CELL CARCINOMA IN SITU (SCCIS)    Return in about 10 weeks (around 12/09/2023) for TBSE hx of AKs, SCC.  I, Sonya Hupman, RMA, am acting as scribe for Elie Goody, MD .   Documentation: I have reviewed the above documentation for accuracy  and completeness, and I agree with the above.  Elie Goody, MD

## 2023-11-02 ENCOUNTER — Ambulatory Visit: Payer: Medicare HMO | Admitting: Dermatology

## 2023-12-13 ENCOUNTER — Ambulatory Visit: Payer: Medicare HMO | Admitting: Dermatology

## 2023-12-13 ENCOUNTER — Encounter: Payer: Self-pay | Admitting: Dermatology

## 2023-12-13 DIAGNOSIS — D1801 Hemangioma of skin and subcutaneous tissue: Secondary | ICD-10-CM

## 2023-12-13 DIAGNOSIS — L570 Actinic keratosis: Secondary | ICD-10-CM

## 2023-12-13 DIAGNOSIS — W908XXA Exposure to other nonionizing radiation, initial encounter: Secondary | ICD-10-CM

## 2023-12-13 DIAGNOSIS — L814 Other melanin hyperpigmentation: Secondary | ICD-10-CM | POA: Diagnosis not present

## 2023-12-13 DIAGNOSIS — Z1283 Encounter for screening for malignant neoplasm of skin: Secondary | ICD-10-CM

## 2023-12-13 DIAGNOSIS — L578 Other skin changes due to chronic exposure to nonionizing radiation: Secondary | ICD-10-CM

## 2023-12-13 DIAGNOSIS — L821 Other seborrheic keratosis: Secondary | ICD-10-CM | POA: Diagnosis not present

## 2023-12-13 DIAGNOSIS — D492 Neoplasm of unspecified behavior of bone, soft tissue, and skin: Secondary | ICD-10-CM

## 2023-12-13 DIAGNOSIS — Z86007 Personal history of in-situ neoplasm of skin: Secondary | ICD-10-CM

## 2023-12-13 DIAGNOSIS — D229 Melanocytic nevi, unspecified: Secondary | ICD-10-CM

## 2023-12-13 DIAGNOSIS — Z872 Personal history of diseases of the skin and subcutaneous tissue: Secondary | ICD-10-CM

## 2023-12-13 NOTE — Progress Notes (Signed)
 Follow-Up Visit   Subjective  Wesley Collins is a 68 y.o. male who presents for the following: Skin Cancer Screening and Full Body Skin Exam. Patient with history of SCCIS. Patient reports his did treat L posterior neck with 5FU cream and discontinued when he noticed a reaction.   The patient presents for Total-Body Skin Exam (TBSE) for skin cancer screening and mole check. The patient has spots, moles and lesions to be evaluated, some may be new or changing and the patient may have concern these could be cancer.   The following portions of the chart were reviewed this encounter and updated as appropriate: medications, allergies, medical history  Review of Systems:  No other skin or systemic complaints except as noted in HPI or Assessment and Plan.  Objective  Well appearing patient in no apparent distress; mood and affect are within normal limits.  A full examination was performed including scalp, head, eyes, ears, nose, lips, neck, chest, axillae, abdomen, back, buttocks, bilateral upper extremities, bilateral lower extremities, hands, feet, fingers, toes, fingernails, and toenails. All findings within normal limits unless otherwise noted below.   Relevant physical exam findings are noted in the Assessment and Plan.  Scalp x2, Inferior to R ear x1, R forearm x6 (9) Pink scaly macules Right upper posterior arm 6mm keratotic pink papule   Left upper back 1cm brown irregular patch   Assessment & Plan   SKIN CANCER SCREENING PERFORMED TODAY.  ACTINIC DAMAGE - Chronic condition, secondary to cumulative UV/sun exposure - diffuse scaly erythematous macules with underlying dyspigmentation - Recommend daily broad spectrum sunscreen SPF 30+ to sun-exposed areas, reapply every 2 hours as needed.  - Staying in the shade or wearing long sleeves, sun glasses (UVA+UVB protection) and wide brim hats (4-inch brim around the entire circumference of the hat) are also recommended for sun  protection.  - Call for new or changing lesions.  LENTIGINES, SEBORRHEIC KERATOSES, HEMANGIOMAS - Benign normal skin lesions - Benign-appearing - Call for any changes  MELANOCYTIC NEVI - Tan-brown and/or pink-flesh-colored symmetric macules and papules - Benign appearing on exam today - Observation - Call clinic for new or changing moles - Recommend daily use of broad spectrum spf 30+ sunscreen to sun-exposed areas.   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 SCCis arising in AK. Treated with 5FU/Calcipotriene- 05/12/2023 - No evidence of recurrence today - Recommend regular full body skin exams - Recommend daily broad spectrum sunscreen SPF 30+ to sun-exposed areas, reapply every 2 hours as needed.  - Call if any new or changing lesions are noted between office visits  HISTORY OF PRECANCEROUS ACTINIC KERATOSIS - site(s) of PreCancerous Actinic Keratosis clear today. - these may recur and new lesions may form requiring treatment to prevent transformation into skin cancer - observe for new or changing spots and contact [facility] for appointment if occur - photoprotection with sun protective clothing; sunglasses and broad spectrum sunscreen with SPF of at least 30 + and frequent self skin exams recommended - yearly exams by a dermatologist recommended for persons with history of PreCancerous Actinic Keratoses  AK (ACTINIC KERATOSIS) (9) Scalp x2, Inferior to R ear x1, R forearm x6 (9) Actinic keratoses are precancerous spots that appear secondary to cumulative UV radiation exposure/sun exposure over time. They are chronic with expected duration over 1 year. A portion of actinic keratoses will progress to squamous cell carcinoma of the skin. It is not possible to reliably predict which spots will  progress to skin cancer and so treatment is recommended to prevent development of skin cancer.  Recommend daily broad spectrum sunscreen SPF 30+ to  sun-exposed areas, reapply every 2 hours as needed.  Recommend staying in the shade or wearing long sleeves, sun glasses (UVA+UVB protection) and wide brim hats (4-inch brim around the entire circumference of the hat). Call for new or changing lesions. Destruction of lesion - Scalp x2, Inferior to R ear x1, R forearm x6 (9) Complexity: simple   Destruction method: cryotherapy   Informed consent: discussed and consent obtained   Timeout:  patient name, date of birth, surgical site, and procedure verified Lesion destroyed using liquid nitrogen: Yes   Region frozen until ice ball extended beyond lesion: Yes   Cryo cycles: 1 or 2. Outcome: patient tolerated procedure well with no complications   Post-procedure details: wound care instructions given   NEOPLASM OF SKIN (2) Right upper posterior arm Skin / nail biopsy Type of biopsy: tangential   Informed consent: discussed and consent obtained   Timeout: patient name, date of birth, surgical site, and procedure verified   Procedure prep:  Patient was prepped and draped in usual sterile fashion Prep type:  Isopropyl alcohol Anesthesia: the lesion was anesthetized in a standard fashion   Anesthetic:  1% lidocaine  w/ epinephrine 1-100,000 buffered w/ 8.4% NaHCO3 Instrument used: DermaBlade   Hemostasis achieved with: pressure and aluminum chloride   Outcome: patient tolerated procedure well   Post-procedure details: sterile dressing applied and wound care instructions given   Dressing type: bandage and petrolatum   Specimen 1 - Surgical pathology Differential Diagnosis: AK vs SCC  Check Margins: No 6mm keratotic pink papule Left upper back Skin / nail biopsy Type of biopsy: tangential   Informed consent: discussed and consent obtained   Timeout: patient name, date of birth, surgical site, and procedure verified   Procedure prep:  Patient was prepped and draped in usual sterile fashion Prep type:  Isopropyl alcohol Anesthesia: the  lesion was anesthetized in a standard fashion   Anesthetic:  1% lidocaine  w/ epinephrine 1-100,000 buffered w/ 8.4% NaHCO3 Instrument used: DermaBlade   Hemostasis achieved with: pressure and aluminum chloride   Outcome: patient tolerated procedure well   Post-procedure details: sterile dressing applied and wound care instructions given   Dressing type: bandage and petrolatum   Specimen 2 - Surgical pathology Differential Diagnosis: dysplastic vs melanoma vs sk   Check Margins: No 1cm brown irregular patch MULTIPLE BENIGN NEVI   LENTIGINES   ACTINIC ELASTOSIS   SEBORRHEIC KERATOSES   CHERRY ANGIOMA   Return in about 6 months (around 06/14/2024) for w/ Dr. Felipe Horton, HxSCCIS, HxAK, TBSE.  I, Jacquelynn Vera, CMA, am acting as scribe for Harris Liming, MD .   Documentation: I have reviewed the above documentation for accuracy and completeness, and I agree with the above.  Harris Liming, MD

## 2023-12-13 NOTE — Patient Instructions (Addendum)
Wound Care Instructions  Cleanse wound gently with soap and water once a day then pat dry with clean gauze. Apply a thin coat of Petrolatum (petroleum jelly, "Vaseline") over the wound (unless you have an allergy to this). We recommend that you use a new, sterile tube of Vaseline. Do not pick or remove scabs. Do not remove the yellow or white "healing tissue" from the base of the wound.  Cover the wound with fresh, clean, nonstick gauze and secure with paper tape. You may use Band-Aids in place of gauze and tape if the wound is small enough, but would recommend trimming much of the tape off as there is often too much. Sometimes Band-Aids can irritate the skin.  You should call the office for your biopsy report after 1 week if you have not already been contacted.  If you experience any problems, such as abnormal amounts of bleeding, swelling, significant bruising, significant pain, or evidence of infection, please call the office immediately.  FOR ADULT SURGERY PATIENTS: If you need something for pain relief you may take 1 extra strength Tylenol (acetaminophen) AND 2 Ibuprofen (200mg  each) together every 4 hours as needed for pain. (do not take these if you are allergic to them or if you have a reason you should not take them.) Typically, you may only need pain medication for 1 to 3 days.    Cryotherapy Aftercare  Wash gently with soap and water everyday.   Apply Vaseline and Band-Aid daily until healed.    Recommend daily broad spectrum sunscreen SPF 30+ to sun-exposed areas, reapply every 2 hours as needed. Call for new or changing lesions.  Staying in the shade or wearing long sleeves, sun glasses (UVA+UVB protection) and wide brim hats (4-inch brim around the entire circumference of the hat) are also recommended for sun protection.    Melanoma ABCDEs  Melanoma is the most dangerous type of skin cancer, and is the leading cause of death from skin disease.  You are more likely to develop  melanoma if you: Have light-colored skin, light-colored eyes, or red or blond hair Spend a lot of time in the sun Tan regularly, either outdoors or in a tanning bed Have had blistering sunburns, especially during childhood Have a close family member who has had a melanoma Have atypical moles or large birthmarks  Early detection of melanoma is key since treatment is typically straightforward and cure rates are extremely high if we catch it early.   The first sign of melanoma is often a change in a mole or a new dark spot.  The ABCDE system is a way of remembering the signs of melanoma.  A for asymmetry:  The two halves do not match. B for border:  The edges of the growth are irregular. C for color:  A mixture of colors are present instead of an even brown color. D for diameter:  Melanomas are usually (but not always) greater than 6mm - the size of a pencil eraser. E for evolution:  The spot keeps changing in size, shape, and color.  Please check your skin once per month between visits. You can use a small mirror in front and a large mirror behind you to keep an eye on the back side or your body.   If you see any new or changing lesions before your next follow-up, please call to schedule a visit.  Please continue daily skin protection including broad spectrum sunscreen SPF 30+ to sun-exposed areas, reapplying every 2 hours as  needed when you're outdoors.   Staying in the shade or wearing long sleeves, sun glasses (UVA+UVB protection) and wide brim hats (4-inch brim around the entire circumference of the hat) are also recommended for sun protection.    Due to recent changes in healthcare laws, you may see results of your pathology and/or laboratory studies on MyChart before the doctors have had a chance to review them. We understand that in some cases there may be results that are confusing or concerning to you. Please understand that not all results are received at the same time and often the  doctors may need to interpret multiple results in order to provide you with the best plan of care or course of treatment. Therefore, we ask that you please give [place] 2 business days to thoroughly review all your results before contacting the office for clarification. Should we see a critical lab result, you will be contacted sooner.   If You Need Anything After Your Visit  If you have any questions or concerns for your doctor, please call our main line at [phone] and press option 4 to reach your doctor's medical assistant. If no one answers, please leave a voicemail as directed and we will return your call as soon as possible. Messages left after 4 pm will be answered the following business day.   You may also send [place] a message via MyChart. We typically respond to MyChart messages within 1-2 business days.  For prescription refills, please ask your pharmacy to contact our office. Our fax number is [phone].  If you have an urgent issue when the clinic is closed that cannot wait until the next business day, you can page your doctor at the number below.    Please note that while we do our best to be available for urgent issues outside of office hours, we are not available 24/7.   If you have an urgent issue and are unable to reach [place], you may choose to seek medical care at your doctor's office, retail clinic, urgent care center, or emergency room.  If you have a medical emergency, please immediately call 911 or go to the emergency department.  Pager Numbers  - Dr. Gwen Pounds: [phone]  - Dr. Roseanne Reno: [phone]  - Dr. Katrinka Blazing: [phone]   In the event of inclement weather, please call our main line at [phone] for an update on the status of any delays or closures.  Dermatology Medication Tips: Please keep the boxes that topical medications come in in order to help keep track of the instructions about where and how to use these. Pharmacies typically print the medication  instructions only on the boxes and not directly on the medication tubes.   If your medication is too expensive, please contact our office at [phone] option 4 or send [place] a message through MyChart.   We are unable to tell what your co-pay for medications will be in advance as this is different depending on your insurance coverage. However, we may be able to find a substitute medication at lower cost or fill out paperwork to get insurance to cover a needed medication.   If a prior authorization is required to get your medication covered by your insurance company, please allow [place] 1-2 business days to complete this process.  Drug prices often vary depending on where the prescription is filled and some pharmacies may offer cheaper prices.  The website www.goodrx.com contains coupons for medications through different pharmacies. The prices here do not account for what  the cost may be with help from insurance (it may be cheaper with your insurance), but the website can give you the price if you did not use any insurance.  - You can print the associated coupon and take it with your prescription to the pharmacy.  - You may also stop by our office during regular business hours and pick up a GoodRx coupon card.  - If you need your prescription sent electronically to a different pharmacy, notify our office through [facility] or by phone at [phone] option 4.     Si Usted Necesita Algo Despus de Su Visita  Tambin puede enviarnos un mensaje a travs de Clinical cytogeneticist. Por lo general respondemos a los mensajes de MyChart en el transcurso de 1 a 2 das hbiles.  Para renovar recetas, por favor pida a su farmacia que se ponga en contacto con nuestra oficina. Annie Sable de fax es [place] [phone].  Si tiene un asunto urgente cuando la clnica est cerrada y que no puede esperar hasta el siguiente da hbil, puede llamar/localizar a su doctor(a) al nmero que aparece a continuacin.   Por  favor, tenga en cuenta que aunque hacemos todo lo posible para estar disponibles para asuntos urgentes fuera del horario de [place], no estamos disponibles las 24 horas del da, los 7 [address]  [address] de la [place].   Si tiene un problema urgente y no puede comunicarse con nosotros, puede optar por buscar atencin mdica  en el consultorio de su doctor(a), en una clnica privada, en un centro de atencin urgente o en una sala de emergencias.  Si tiene Engineer, drilling, por favor llame inmediatamente al 911 o vaya a la sala de emergencias.  Nmeros de bper  - Dr. Gwen Pounds: [phone]  - Dra. Roseanne Reno: [phone]  - Dr. Katrinka Blazing: [phone]   En caso de inclemencias del tiempo, por favor llame a Lacy Duverney principal al [phone] para una actualizacin sobre el [place] de cualquier retraso o cierre.  Consejos para la medicacin en dermatologa: Por favor, guarde las cajas en las que vienen los medicamentos de uso tpico para ayudarle a seguir las instrucciones sobre dnde y cmo usarlos. Las farmacias generalmente imprimen las instrucciones del medicamento slo en las cajas y no directamente en los tubos del [place].   Si su medicamento es muy caro, por favor, pngase en contacto con Rolm Gala llamando al [phone] y presione la opcin 4 o envenos un mensaje a travs de Clinical cytogeneticist.   No podemos decirle cul ser su copago por los medicamentos por adelantado ya que esto es diferente dependiendo de la cobertura de su seguro. Sin embargo, es posible que podamos encontrar un medicamento sustituto a Audiological scientist un formulario para que el seguro cubra el medicamento que se considera necesario.   Si se requiere una autorizacin previa para que su compaa de seguros [place] su medicamento, por favor permtanos de 1 a 2 das hbiles para completar [address]  Los precios de los medicamentos varan con frecuencia dependiendo del Environmental consultant de dnde se surte la receta y alguna farmacias  pueden ofrecer precios ms baratos.  El sitio web www.goodrx.com tiene cupones para medicamentos de Health and safety inspector. Los precios aqu no tienen en cuenta lo que podra costar con la ayuda del seguro (puede ser ms barato con su seguro), pero el sitio web puede darle el precio si no utiliz Tourist information centre manager.  - Puede imprimir el cupn correspondiente y llevarlo con su receta a la farmacia.  - Tambin puede  pasar por nuestra oficina durante el horario de atencin regular y Education officer, museum una tarjeta de cupones de GoodRx.  - Si necesita que su receta se enve electrnicamente a una farmacia diferente, informe a nuestra oficina a travs de MyChart de  o por telfono llamando al [phone] y presione la opcin 4.

## 2023-12-16 LAB — SURGICAL PATHOLOGY

## 2023-12-19 ENCOUNTER — Ambulatory Visit: Payer: Self-pay | Admitting: Dermatology

## 2024-06-15 ENCOUNTER — Ambulatory Visit: Admitting: Dermatology
